# Patient Record
Sex: Female | Born: 1970 | Race: Black or African American | Hispanic: No
Health system: Southern US, Community
[De-identification: ages and names within clinical notes are randomized; demographics above are authoritative.]

---

## 2021-03-20 ENCOUNTER — Inpatient Hospital Stay (HOSPITAL_COMMUNITY): Payer: Medicare Other

## 2021-03-20 ENCOUNTER — Emergency Department (HOSPITAL_COMMUNITY): Payer: Medicare Other

## 2021-03-20 ENCOUNTER — Inpatient Hospital Stay (HOSPITAL_COMMUNITY)
Admission: EM | Admit: 2021-03-20 | Discharge: 2021-04-18 | DRG: 207 | Disposition: E | Payer: Medicare Other | Attending: Pulmonary Disease | Admitting: Pulmonary Disease

## 2021-03-20 DIAGNOSIS — Z66 Do not resuscitate: Secondary | ICD-10-CM | POA: Diagnosis not present

## 2021-03-20 DIAGNOSIS — I517 Cardiomegaly: Secondary | ICD-10-CM | POA: Diagnosis not present

## 2021-03-20 DIAGNOSIS — R570 Cardiogenic shock: Secondary | ICD-10-CM | POA: Diagnosis present

## 2021-03-20 DIAGNOSIS — E662 Morbid (severe) obesity with alveolar hypoventilation: Secondary | ICD-10-CM | POA: Diagnosis present

## 2021-03-20 DIAGNOSIS — Z7189 Other specified counseling: Secondary | ICD-10-CM | POA: Diagnosis not present

## 2021-03-20 DIAGNOSIS — I469 Cardiac arrest, cause unspecified: Secondary | ICD-10-CM | POA: Diagnosis present

## 2021-03-20 DIAGNOSIS — N83202 Unspecified ovarian cyst, left side: Secondary | ICD-10-CM | POA: Diagnosis present

## 2021-03-20 DIAGNOSIS — Z9289 Personal history of other medical treatment: Secondary | ICD-10-CM

## 2021-03-20 DIAGNOSIS — J441 Chronic obstructive pulmonary disease with (acute) exacerbation: Secondary | ICD-10-CM | POA: Diagnosis not present

## 2021-03-20 DIAGNOSIS — N179 Acute kidney failure, unspecified: Secondary | ICD-10-CM | POA: Diagnosis present

## 2021-03-20 DIAGNOSIS — J189 Pneumonia, unspecified organism: Secondary | ICD-10-CM | POA: Diagnosis present

## 2021-03-20 DIAGNOSIS — F17290 Nicotine dependence, other tobacco product, uncomplicated: Secondary | ICD-10-CM | POA: Diagnosis present

## 2021-03-20 DIAGNOSIS — R34 Anuria and oliguria: Secondary | ICD-10-CM | POA: Diagnosis not present

## 2021-03-20 DIAGNOSIS — E872 Acidosis: Secondary | ICD-10-CM | POA: Diagnosis present

## 2021-03-20 DIAGNOSIS — I509 Heart failure, unspecified: Secondary | ICD-10-CM | POA: Diagnosis not present

## 2021-03-20 DIAGNOSIS — J069 Acute upper respiratory infection, unspecified: Secondary | ICD-10-CM | POA: Diagnosis present

## 2021-03-20 DIAGNOSIS — I5033 Acute on chronic diastolic (congestive) heart failure: Secondary | ICD-10-CM | POA: Diagnosis present

## 2021-03-20 DIAGNOSIS — Z9181 History of falling: Secondary | ICD-10-CM

## 2021-03-20 DIAGNOSIS — D72829 Elevated white blood cell count, unspecified: Secondary | ICD-10-CM | POA: Diagnosis not present

## 2021-03-20 DIAGNOSIS — J449 Chronic obstructive pulmonary disease, unspecified: Secondary | ICD-10-CM | POA: Diagnosis present

## 2021-03-20 DIAGNOSIS — G931 Anoxic brain damage, not elsewhere classified: Secondary | ICD-10-CM | POA: Diagnosis present

## 2021-03-20 DIAGNOSIS — R9389 Abnormal findings on diagnostic imaging of other specified body structures: Secondary | ICD-10-CM

## 2021-03-20 DIAGNOSIS — E669 Obesity, unspecified: Secondary | ICD-10-CM | POA: Diagnosis present

## 2021-03-20 DIAGNOSIS — J9622 Acute and chronic respiratory failure with hypercapnia: Secondary | ICD-10-CM | POA: Diagnosis present

## 2021-03-20 DIAGNOSIS — Z515 Encounter for palliative care: Secondary | ICD-10-CM | POA: Diagnosis not present

## 2021-03-20 DIAGNOSIS — G253 Myoclonus: Secondary | ICD-10-CM | POA: Diagnosis present

## 2021-03-20 DIAGNOSIS — J45901 Unspecified asthma with (acute) exacerbation: Secondary | ICD-10-CM | POA: Diagnosis present

## 2021-03-20 DIAGNOSIS — Z6841 Body Mass Index (BMI) 40.0 and over, adult: Secondary | ICD-10-CM

## 2021-03-20 DIAGNOSIS — Z529 Donor of unspecified organ or tissue: Secondary | ICD-10-CM

## 2021-03-20 DIAGNOSIS — E876 Hypokalemia: Secondary | ICD-10-CM | POA: Diagnosis not present

## 2021-03-20 DIAGNOSIS — I468 Cardiac arrest due to other underlying condition: Secondary | ICD-10-CM | POA: Diagnosis present

## 2021-03-20 DIAGNOSIS — R739 Hyperglycemia, unspecified: Secondary | ICD-10-CM | POA: Diagnosis not present

## 2021-03-20 DIAGNOSIS — T17908A Unspecified foreign body in respiratory tract, part unspecified causing other injury, initial encounter: Secondary | ICD-10-CM | POA: Diagnosis present

## 2021-03-20 DIAGNOSIS — J9621 Acute and chronic respiratory failure with hypoxia: Secondary | ICD-10-CM | POA: Diagnosis present

## 2021-03-20 DIAGNOSIS — F17210 Nicotine dependence, cigarettes, uncomplicated: Secondary | ICD-10-CM | POA: Diagnosis present

## 2021-03-20 DIAGNOSIS — J9601 Acute respiratory failure with hypoxia: Secondary | ICD-10-CM

## 2021-03-20 DIAGNOSIS — Z20822 Contact with and (suspected) exposure to covid-19: Secondary | ICD-10-CM | POA: Diagnosis present

## 2021-03-20 DIAGNOSIS — J9690 Respiratory failure, unspecified, unspecified whether with hypoxia or hypercapnia: Secondary | ICD-10-CM

## 2021-03-20 DIAGNOSIS — R296 Repeated falls: Secondary | ICD-10-CM | POA: Diagnosis present

## 2021-03-20 DIAGNOSIS — Z9911 Dependence on respirator [ventilator] status: Secondary | ICD-10-CM | POA: Diagnosis not present

## 2021-03-20 DIAGNOSIS — A419 Sepsis, unspecified organism: Secondary | ICD-10-CM

## 2021-03-20 DIAGNOSIS — J328 Other chronic sinusitis: Secondary | ICD-10-CM | POA: Diagnosis present

## 2021-03-20 DIAGNOSIS — I5031 Acute diastolic (congestive) heart failure: Secondary | ICD-10-CM

## 2021-03-20 LAB — CBC WITH DIFFERENTIAL/PLATELET
Abs Immature Granulocytes: 0.19 10*3/uL — ABNORMAL HIGH (ref 0.00–0.07)
Basophils Absolute: 0.1 10*3/uL (ref 0.0–0.1)
Basophils Relative: 0 %
Eosinophils Absolute: 0 10*3/uL (ref 0.0–0.5)
Eosinophils Relative: 0 %
HCT: 49.5 % — ABNORMAL HIGH (ref 36.0–46.0)
Hemoglobin: 15.3 g/dL — ABNORMAL HIGH (ref 12.0–15.0)
Immature Granulocytes: 1 %
Lymphocytes Relative: 5 %
Lymphs Abs: 0.8 10*3/uL (ref 0.7–4.0)
MCH: 32.3 pg (ref 26.0–34.0)
MCHC: 30.9 g/dL (ref 30.0–36.0)
MCV: 104.7 fL — ABNORMAL HIGH (ref 80.0–100.0)
Monocytes Absolute: 1.5 10*3/uL — ABNORMAL HIGH (ref 0.1–1.0)
Monocytes Relative: 9 %
Neutro Abs: 14.9 10*3/uL — ABNORMAL HIGH (ref 1.7–7.7)
Neutrophils Relative %: 85 %
Platelets: 289 10*3/uL (ref 150–400)
RBC: 4.73 MIL/uL (ref 3.87–5.11)
RDW: 13.5 % (ref 11.5–15.5)
WBC: 17.5 10*3/uL — ABNORMAL HIGH (ref 4.0–10.5)
nRBC: 0.3 % — ABNORMAL HIGH (ref 0.0–0.2)

## 2021-03-20 LAB — I-STAT ARTERIAL BLOOD GAS, ED
Acid-base deficit: 3 mmol/L — ABNORMAL HIGH (ref 0.0–2.0)
Bicarbonate: 29.7 mmol/L — ABNORMAL HIGH (ref 20.0–28.0)
Calcium, Ion: 1.19 mmol/L (ref 1.15–1.40)
HCT: 49 % — ABNORMAL HIGH (ref 36.0–46.0)
Hemoglobin: 16.7 g/dL — ABNORMAL HIGH (ref 12.0–15.0)
O2 Saturation: 100 %
Patient temperature: 97.5
Potassium: 4.2 mmol/L (ref 3.5–5.1)
Sodium: 137 mmol/L (ref 135–145)
TCO2: 32 mmol/L (ref 22–32)
pCO2 arterial: 88 mmHg (ref 32.0–48.0)
pH, Arterial: 7.132 — CL (ref 7.350–7.450)
pO2, Arterial: 240 mmHg — ABNORMAL HIGH (ref 83.0–108.0)

## 2021-03-20 LAB — ECHOCARDIOGRAM COMPLETE
Area-P 1/2: 2.91 cm2
Height: 69 in
S' Lateral: 3.1 cm
Weight: 5266.35 oz

## 2021-03-20 LAB — PROTIME-INR
INR: 1.2 (ref 0.8–1.2)
INR: 1.1 (ref 0.8–1.2)
Prothrombin Time: 14.7 seconds (ref 11.4–15.2)
Prothrombin Time: 13.6 seconds (ref 11.4–15.2)

## 2021-03-20 LAB — POCT I-STAT 7, (LYTES, BLD GAS, ICA,H+H)
Acid-Base Excess: 1 mmol/L (ref 0.0–2.0)
Bicarbonate: 28.4 mmol/L — ABNORMAL HIGH (ref 20.0–28.0)
Calcium, Ion: 1.13 mmol/L — ABNORMAL LOW (ref 1.15–1.40)
HCT: 49 % — ABNORMAL HIGH (ref 36.0–46.0)
Hemoglobin: 16.7 g/dL — ABNORMAL HIGH (ref 12.0–15.0)
O2 Saturation: 91 %
Patient temperature: 36.2
Potassium: 4.1 mmol/L (ref 3.5–5.1)
Sodium: 141 mmol/L (ref 135–145)
TCO2: 30 mmol/L (ref 22–32)
pCO2 arterial: 54.4 mmHg — ABNORMAL HIGH (ref 32.0–48.0)
pH, Arterial: 7.322 — ABNORMAL LOW (ref 7.350–7.450)
pO2, Arterial: 64 mmHg — ABNORMAL LOW (ref 83.0–108.0)

## 2021-03-20 LAB — CBC

## 2021-03-20 LAB — URINALYSIS, ROUTINE W REFLEX MICROSCOPIC
Bilirubin Urine: NEGATIVE
Bilirubin Urine: NEGATIVE
Glucose, UA: 150 mg/dL — AB
Glucose, UA: NEGATIVE mg/dL
Hgb urine dipstick: NEGATIVE
Ketones, ur: NEGATIVE mg/dL
Ketones, ur: NEGATIVE mg/dL
Leukocytes,Ua: NEGATIVE
Leukocytes,Ua: NEGATIVE
Nitrite: NEGATIVE
Nitrite: NEGATIVE
Protein, ur: >=300 mg/dL — AB
Protein, ur: 30 mg/dL — AB
Specific Gravity, Urine: 1.013 (ref 1.005–1.030)
Specific Gravity, Urine: 1.039 — ABNORMAL HIGH (ref 1.005–1.030)
pH: 7 (ref 5.0–8.0)
pH: 5 (ref 5.0–8.0)

## 2021-03-20 LAB — COMPREHENSIVE METABOLIC PANEL
ALT: 31 U/L (ref 0–44)
AST: 58 U/L — ABNORMAL HIGH (ref 15–41)
Albumin: 3.6 g/dL (ref 3.5–5.0)
Alkaline Phosphatase: 75 U/L (ref 38–126)
Anion gap: 14 (ref 5–15)
BUN: 15 mg/dL (ref 6–20)
CO2: 23 mmol/L (ref 22–32)
Calcium: 8.2 mg/dL — ABNORMAL LOW (ref 8.9–10.3)
Chloride: 100 mmol/L (ref 98–111)
Creatinine, Ser: 1.42 mg/dL — ABNORMAL HIGH (ref 0.44–1.00)
GFR, Estimated: 45 mL/min — ABNORMAL LOW (ref >60)
Glucose, Bld: 225 mg/dL — ABNORMAL HIGH (ref 70–99)
Potassium: 4.2 mmol/L (ref 3.5–5.1)
Sodium: 137 mmol/L (ref 135–145)
Total Bilirubin: 0.6 mg/dL (ref 0.3–1.2)
Total Protein: 7.2 g/dL (ref 6.5–8.1)

## 2021-03-20 LAB — I-STAT CHEM 8, ED
BUN: 16 mg/dL (ref 6–20)
Calcium, Ion: 0.99 mmol/L — ABNORMAL LOW (ref 1.15–1.40)
Chloride: 101 mmol/L (ref 98–111)
Creatinine, Ser: 1.2 mg/dL — ABNORMAL HIGH (ref 0.44–1.00)
Glucose, Bld: 228 mg/dL — ABNORMAL HIGH (ref 70–99)
HCT: 52 % — ABNORMAL HIGH (ref 36.0–46.0)
Hemoglobin: 17.7 g/dL — ABNORMAL HIGH (ref 12.0–15.0)
Potassium: 4.2 mmol/L (ref 3.5–5.1)
Sodium: 140 mmol/L (ref 135–145)
TCO2: 28 mmol/L (ref 22–32)

## 2021-03-20 LAB — SARS CORONAVIRUS 2 (TAT 6-24 HRS): SARS Coronavirus 2: NEGATIVE

## 2021-03-20 LAB — RAPID URINE DRUG SCREEN, HOSP PERFORMED
Amphetamines: NOT DETECTED
Barbiturates: NOT DETECTED
Benzodiazepines: POSITIVE — AB
Cocaine: NOT DETECTED
Opiates: POSITIVE — AB
Tetrahydrocannabinol: NOT DETECTED

## 2021-03-20 LAB — GLUCOSE, CAPILLARY
Glucose-Capillary: 152 mg/dL — ABNORMAL HIGH (ref 70–99)
Glucose-Capillary: 156 mg/dL — ABNORMAL HIGH (ref 70–99)
Glucose-Capillary: 136 mg/dL — ABNORMAL HIGH (ref 70–99)
Glucose-Capillary: 195 mg/dL — ABNORMAL HIGH (ref 70–99)

## 2021-03-20 LAB — PROCALCITONIN: Procalcitonin: 0.1 ng/mL

## 2021-03-20 LAB — TROPONIN I (HIGH SENSITIVITY)
Troponin I (High Sensitivity): 145 ng/L (ref <18)
Troponin I (High Sensitivity): 197 ng/L (ref <18)

## 2021-03-20 LAB — HIV ANTIBODY (ROUTINE TESTING W REFLEX): HIV Screen 4th Generation wRfx: NONREACTIVE

## 2021-03-20 LAB — I-STAT BETA HCG BLOOD, ED (MC, WL, AP ONLY): I-stat hCG, quantitative: <5 m[IU]/mL (ref <5)

## 2021-03-20 LAB — CREATININE, SERUM
Creatinine, Ser: 1.13 mg/dL — ABNORMAL HIGH (ref 0.44–1.00)
GFR, Estimated: 59 mL/min — ABNORMAL LOW (ref >60)

## 2021-03-20 LAB — HEMOGLOBIN A1C
Hgb A1c MFr Bld: 6.5 % — ABNORMAL HIGH (ref 4.8–5.6)
Mean Plasma Glucose: 139.85 mg/dL

## 2021-03-20 LAB — LIPASE, BLOOD: Lipase: 29 U/L (ref 11–51)

## 2021-03-20 LAB — STREP PNEUMONIAE URINARY ANTIGEN: Strep Pneumo Urinary Antigen: NEGATIVE

## 2021-03-20 LAB — APTT: aPTT: 32 seconds (ref 24–36)

## 2021-03-20 LAB — AMYLASE: Amylase: 100 U/L (ref 28–100)

## 2021-03-20 LAB — LACTIC ACID, PLASMA
Lactic Acid, Venous: 7.2 mmol/L (ref 0.5–1.9)
Lactic Acid, Venous: 5.7 mmol/L (ref 0.5–1.9)
Lactic Acid, Venous: 2.3 mmol/L (ref 0.5–1.9)
Lactic Acid, Venous: 3.4 mmol/L (ref 0.5–1.9)

## 2021-03-20 LAB — TSH: TSH: 13.078 u[IU]/mL — ABNORMAL HIGH (ref 0.350–4.500)

## 2021-03-20 LAB — CBG MONITORING, ED: Glucose-Capillary: 141 mg/dL — ABNORMAL HIGH (ref 70–99)

## 2021-03-20 LAB — MAGNESIUM: Magnesium: 2.2 mg/dL (ref 1.7–2.4)

## 2021-03-20 LAB — BRAIN NATRIURETIC PEPTIDE: B Natriuretic Peptide: 147.4 pg/mL — ABNORMAL HIGH (ref 0.0–100.0)

## 2021-03-20 LAB — AMMONIA: Ammonia: 78 umol/L — ABNORMAL HIGH (ref 9–35)

## 2021-03-20 LAB — MRSA PCR SCREENING: MRSA by PCR: NEGATIVE

## 2021-03-20 MED ORDER — FENTANYL CITRATE (PF) 100 MCG/2ML IJ SOLN
100.0000 ug | INTRAMUSCULAR | Status: DC | PRN
Start: 2021-03-20 — End: 2021-03-24
  Administered 2021-03-22 (×2): 100 ug via INTRAVENOUS
  Filled 2021-03-20 (×5): qty 2

## 2021-03-20 MED ORDER — HEPARIN SODIUM (PORCINE) 5000 UNIT/ML IJ SOLN
5000.0000 [IU] | Freq: Three times a day (TID) | INTRAMUSCULAR | Status: DC
  Administered 2021-03-20 – 2021-03-24 (×12): 5000 [IU] via SUBCUTANEOUS
  Filled 2021-03-20 (×12): qty 1

## 2021-03-20 MED ORDER — VITAL AF 1.2 CAL PO LIQD
1000.0000 mL | ORAL | Status: DC
  Administered 2021-03-20: 1000 mL

## 2021-03-20 MED ORDER — FENTANYL CITRATE (PF) 100 MCG/2ML IJ SOLN
100.0000 ug | INTRAMUSCULAR | Status: DC | PRN
  Administered 2021-03-24: 100 ug via INTRAVENOUS
  Filled 2021-03-20: qty 2

## 2021-03-20 MED ORDER — SODIUM CHLORIDE 0.9 % IV SOLN: 1.0000 g | Freq: Every day | INTRAVENOUS | Status: DC

## 2021-03-20 MED ORDER — ASPIRIN 300 MG RE SUPP
300.0000 mg | RECTAL | Status: AC
  Administered 2021-03-20: 300 mg via RECTAL
  Filled 2021-03-20: qty 1

## 2021-03-20 MED ORDER — VITAL HIGH PROTEIN PO LIQD: 1000.0000 mL | ORAL | Status: DC

## 2021-03-20 MED ORDER — FENTANYL CITRATE (PF) 100 MCG/2ML IJ SOLN
50.0000 ug | Freq: Once | INTRAMUSCULAR | Status: DC
Start: 2021-03-20 — End: 2021-03-24
  Filled 2021-03-20 (×2): qty 2

## 2021-03-20 MED ORDER — PERFLUTREN LIPID MICROSPHERE
1.0000 mL | INTRAVENOUS | Status: AC | PRN
  Administered 2021-03-20: 2 mL via INTRAVENOUS
  Filled 2021-03-20: qty 10

## 2021-03-20 MED ORDER — PROSOURCE TF PO LIQD
45.0000 mL | Freq: Three times a day (TID) | ORAL | Status: DC
  Administered 2021-03-20 – 2021-03-23 (×12): 45 mL
  Filled 2021-03-20 (×12): qty 45

## 2021-03-20 MED ORDER — ALBUTEROL SULFATE (2.5 MG/3ML) 0.083% IN NEBU
2.5000 mg | INHALATION_SOLUTION | Freq: Once | RESPIRATORY_TRACT | Status: AC
  Administered 2021-03-20: 2.5 mg via RESPIRATORY_TRACT

## 2021-03-20 MED ORDER — CHLORHEXIDINE GLUCONATE CLOTH 2 % EX PADS
6.0000 | MEDICATED_PAD | Freq: Every day | CUTANEOUS | Status: DC
  Administered 2021-03-20 – 2021-03-25 (×6): 6 via TOPICAL

## 2021-03-20 MED ORDER — MIDAZOLAM HCL 2 MG/2ML IJ SOLN
2.0000 mg | INTRAMUSCULAR | Status: DC | PRN
  Administered 2021-03-20: 2 mg via INTRAVENOUS
  Filled 2021-03-20 (×3): qty 2

## 2021-03-20 MED ORDER — FENTANYL CITRATE (PF) 100 MCG/2ML IJ SOLN
INTRAMUSCULAR | Status: AC
  Administered 2021-03-20: 100 ug via INTRAVENOUS
  Filled 2021-03-20: qty 2

## 2021-03-20 MED ORDER — ASPIRIN 300 MG RE SUPP: 300.0000 mg | RECTAL | Status: AC

## 2021-03-20 MED ORDER — SODIUM CHLORIDE 0.9 % IV BOLUS
1000.0000 mL | Freq: Once | INTRAVENOUS | Status: AC
  Administered 2021-03-20: 1000 mL via INTRAVENOUS

## 2021-03-20 MED ORDER — NOREPINEPHRINE 4 MG/250ML-% IV SOLN
0.0000 ug/min | INTRAVENOUS | Status: DC
  Administered 2021-03-20: 10 ug/min via INTRAVENOUS
  Filled 2021-03-20: qty 250

## 2021-03-20 MED ORDER — IPRATROPIUM-ALBUTEROL 0.5-2.5 (3) MG/3ML IN SOLN
3.0000 mL | RESPIRATORY_TRACT | Status: DC
  Administered 2021-03-20 – 2021-03-26 (×31): 3 mL via RESPIRATORY_TRACT
  Filled 2021-03-20 (×31): qty 3

## 2021-03-20 MED ORDER — METHYLPREDNISOLONE SODIUM SUCC 125 MG IJ SOLR: 80.0000 mg | Freq: Two times a day (BID) | INTRAMUSCULAR | Status: DC

## 2021-03-20 MED ORDER — SODIUM CHLORIDE 0.9 % IV SOLN: INTRAVENOUS | Status: DC | PRN

## 2021-03-20 MED ORDER — ONDANSETRON HCL 4 MG/2ML IJ SOLN: 4.0000 mg | Freq: Four times a day (QID) | INTRAMUSCULAR | Status: DC | PRN

## 2021-03-20 MED ORDER — ACETAMINOPHEN 160 MG/5ML PO SOLN
650.0000 mg | ORAL | Status: DC
  Administered 2021-03-20 – 2021-03-24 (×22): 650 mg
  Filled 2021-03-20 (×22): qty 20.3

## 2021-03-20 MED ORDER — FENTANYL 2500MCG IN NS 250ML (10MCG/ML) PREMIX INFUSION
25.0000 ug/h | INTRAVENOUS | Status: DC
  Administered 2021-03-20: 50 ug/h via INTRAVENOUS
  Administered 2021-03-20: 400 ug/h via INTRAVENOUS
  Administered 2021-03-21: 125 ug/h via INTRAVENOUS
  Administered 2021-03-21: 400 ug/h via INTRAVENOUS
  Administered 2021-03-22: 50 ug/h via INTRAVENOUS
  Administered 2021-03-23: 150 ug/h via INTRAVENOUS
  Filled 2021-03-20 (×7): qty 250

## 2021-03-20 MED ORDER — SODIUM CHLORIDE 0.9 % IV SOLN
250.0000 mL | INTRAVENOUS | Status: DC
  Administered 2021-03-20 – 2021-03-23 (×2): 250 mL via INTRAVENOUS

## 2021-03-20 MED ORDER — DOCUSATE SODIUM 100 MG PO CAPS: 100.0000 mg | ORAL_CAPSULE | Freq: Two times a day (BID) | ORAL | Status: DC | PRN

## 2021-03-20 MED ORDER — SODIUM CHLORIDE 0.9 % IV SOLN
500.0000 mg | INTRAVENOUS | Status: AC
  Administered 2021-03-20 – 2021-03-24 (×5): 500 mg via INTRAVENOUS
  Filled 2021-03-20 (×5): qty 500

## 2021-03-20 MED ORDER — ORAL CARE MOUTH RINSE
15.0000 mL | OROMUCOSAL | Status: DC
  Administered 2021-03-20 – 2021-03-26 (×52): 15 mL via OROMUCOSAL

## 2021-03-20 MED ORDER — INSULIN ASPART 100 UNIT/ML ~~LOC~~ SOLN
0.0000 [IU] | SUBCUTANEOUS | Status: DC
  Administered 2021-03-20: 3 [IU] via SUBCUTANEOUS
  Administered 2021-03-20: 2 [IU] via SUBCUTANEOUS
  Administered 2021-03-20 (×2): 3 [IU] via SUBCUTANEOUS
  Administered 2021-03-21: 5 [IU] via SUBCUTANEOUS
  Administered 2021-03-21: 3 [IU] via SUBCUTANEOUS
  Administered 2021-03-21 (×3): 5 [IU] via SUBCUTANEOUS
  Administered 2021-03-21 – 2021-03-22 (×2): 3 [IU] via SUBCUTANEOUS
  Administered 2021-03-22: 2 [IU] via SUBCUTANEOUS
  Administered 2021-03-22: 8 [IU] via SUBCUTANEOUS

## 2021-03-20 MED ORDER — ALBUTEROL SULFATE (2.5 MG/3ML) 0.083% IN NEBU
INHALATION_SOLUTION | RESPIRATORY_TRACT | Status: AC
  Administered 2021-03-20: 2.5 mg
  Filled 2021-03-20: qty 3

## 2021-03-20 MED ORDER — FENTANYL BOLUS VIA INFUSION
50.0000 ug | INTRAVENOUS | Status: DC | PRN
  Administered 2021-03-20: 50 ug via INTRAVENOUS
  Filled 2021-03-20: qty 50

## 2021-03-20 MED ORDER — MIDAZOLAM HCL 2 MG/2ML IJ SOLN
2.0000 mg | INTRAMUSCULAR | Status: DC | PRN
  Administered 2021-03-20: 2 mg via INTRAVENOUS
  Filled 2021-03-20: qty 2

## 2021-03-20 MED ORDER — CHLORHEXIDINE GLUCONATE 0.12% ORAL RINSE (MEDLINE KIT)
15.0000 mL | Freq: Two times a day (BID) | OROMUCOSAL | Status: DC
  Administered 2021-03-20 – 2021-03-26 (×12): 15 mL via OROMUCOSAL

## 2021-03-20 MED ORDER — ALBUTEROL SULFATE (2.5 MG/3ML) 0.083% IN NEBU
2.5000 mg | INHALATION_SOLUTION | RESPIRATORY_TRACT | Status: DC | PRN
  Administered 2021-03-22 (×3): 2.5 mg via RESPIRATORY_TRACT
  Filled 2021-03-20 (×2): qty 3

## 2021-03-20 MED ORDER — PANTOPRAZOLE SODIUM 40 MG IV SOLR
40.0000 mg | Freq: Every day | INTRAVENOUS | Status: DC
  Administered 2021-03-20 – 2021-03-23 (×4): 40 mg via INTRAVENOUS
  Filled 2021-03-20 (×4): qty 40

## 2021-03-20 MED ORDER — MIDAZOLAM HCL 2 MG/2ML IJ SOLN
INTRAMUSCULAR | Status: AC
  Administered 2021-03-20: 2 mg via INTRAVENOUS
  Filled 2021-03-20: qty 2

## 2021-03-20 MED ORDER — PROPOFOL 1000 MG/100ML IV EMUL
5.0000 ug/kg/min | INTRAVENOUS | Status: DC
  Administered 2021-03-20: 60 ug/kg/min via INTRAVENOUS
  Administered 2021-03-20: 50 ug/kg/min via INTRAVENOUS
  Administered 2021-03-20: 5 ug/kg/min via INTRAVENOUS
  Administered 2021-03-20: 60 ug/kg/min via INTRAVENOUS
  Administered 2021-03-21: 50 ug/kg/min via INTRAVENOUS
  Administered 2021-03-21: 40 ug/kg/min via INTRAVENOUS
  Administered 2021-03-21: 60 ug/kg/min via INTRAVENOUS
  Administered 2021-03-22: 40 ug/kg/min via INTRAVENOUS
  Administered 2021-03-22: 30 ug/kg/min via INTRAVENOUS
  Administered 2021-03-22 (×2): 40 ug/kg/min via INTRAVENOUS
  Administered 2021-03-22: 5 ug/kg/min via INTRAVENOUS
  Administered 2021-03-22: 30 ug/kg/min via INTRAVENOUS
  Administered 2021-03-23 (×3): 40 ug/kg/min via INTRAVENOUS
  Filled 2021-03-20 (×2): qty 100
  Filled 2021-03-20 (×2): qty 200
  Filled 2021-03-20 (×7): qty 100

## 2021-03-20 MED ORDER — SODIUM CHLORIDE 0.9 % IV SOLN
1.0000 g | Freq: Every day | INTRAVENOUS | Status: DC
  Administered 2021-03-20: 1 g via INTRAVENOUS
  Filled 2021-03-20: qty 10
  Filled 2021-03-20: qty 1

## 2021-03-20 MED ORDER — ACETAMINOPHEN 325 MG PO TABS: 650.0000 mg | ORAL_TABLET | ORAL | Status: DC

## 2021-03-20 MED ORDER — NOREPINEPHRINE 4 MG/250ML-% IV SOLN
2.0000 ug/min | INTRAVENOUS | Status: DC
  Administered 2021-03-21: 2 ug/min via INTRAVENOUS
  Filled 2021-03-20 (×2): qty 250

## 2021-03-20 MED ORDER — POLYETHYLENE GLYCOL 3350 17 G PO PACK: 17.0000 g | PACK | Freq: Every day | ORAL | Status: DC | PRN

## 2021-03-20 MED ORDER — METHYLPREDNISOLONE SODIUM SUCC 125 MG IJ SOLR
80.0000 mg | Freq: Two times a day (BID) | INTRAMUSCULAR | Status: DC
  Administered 2021-03-20 – 2021-03-21 (×2): 80 mg via INTRAVENOUS
  Filled 2021-03-20 (×2): qty 2

## 2021-03-20 MED ORDER — ACETAMINOPHEN 650 MG RE SUPP: 650.0000 mg | RECTAL | Status: DC

## 2021-03-20 MED ORDER — IOHEXOL 350 MG/ML SOLN
100.0000 mL | Freq: Once | INTRAVENOUS | Status: AC | PRN
  Administered 2021-03-20: 100 mL via INTRAVENOUS

## 2021-03-20 NOTE — Progress Notes (Signed)
Initial Nutrition Assessment  DOCUMENTATION CODES:   Obesity unspecified  INTERVENTION:   Tube feeding:  -Vital 1.2 @ 20 ml/hr via OG -Increase by 10 ml Q4 hours to goal rate of 60 ml/hr (1440 ml) -ProSource TF 45 ml TID  Provides: 1848 kcals, 141 grams protein, 1168 ml free water.   NUTRITION DIAGNOSIS:   Increased nutrient needs related to acute illness as evidenced by estimated needs.  GOAL:   Patient will meet greater than or equal to 90% of their needs  MONITOR:   Vent status,Skin,TF tolerance,Weight trends,Labs,I & O's  REASON FOR ASSESSMENT:   Consult,Ventilator Enteral/tube feeding initiation and management  ASSESSMENT:   Patient with PMH significant for asthma and COPD. Presents this admission s/p PEA arrest.   Unknown etiology of cardiac arrest. TTM 36. Off pressors. OG located in the stomach per xray. Okay to start feeding per CCM.   Weight history limited at this time. Utilize EDW of 149.3 kg for now.   Patient is currently intubated on ventilator support MV: 10.6 L/min Temp (24hrs), Avg:96.5 F (35.8 C), Min:94.8 F (34.9 C), Max:97.7 F (36.5 C)   UOP: 250 ml since admit   Medications: SS novolog, solumedrol Labs: Cr 1.13- trending down CBG 141-228  Diet Order:   Diet Order            Diet NPO time specified  Diet effective now                 EDUCATION NEEDS:   Not appropriate for education at this time  Skin:  Skin Assessment: Reviewed RN Assessment  Last BM:  PTA  Height:   Ht Readings from Last 1 Encounters:  04/02/2021 5\' 9"  (1.753 m)    Weight:   Wt Readings from Last 1 Encounters:  04/04/2021 (!) 149.3 kg    BMI:  Body mass index is 48.61 kg/m.  Estimated Nutritional Needs:   Kcal:  05/20/21  Protein:  132-164 grams  Fluid:  >/= 1.6 L/day  [phone] RD, LDN Clinical Nutrition Pager listed in AMION

## 2021-03-20 NOTE — Plan of Care (Signed)
  Problem: Clinical Measurements: Goal: Ability to maintain clinical measurements within normal limits will improve Outcome: Progressing Goal: Diagnostic test results will improve Outcome: Progressing   Problem: Nutrition: Goal: Adequate nutrition will be maintained Outcome: Progressing   

## 2021-03-20 NOTE — Code Documentation (Signed)
Placed pads on pt.

## 2021-03-20 NOTE — H&P (Signed)
NAME:  Haidynn Almendarez, MRN:  [id number], DOB:  [birth year], LOS: 0 ADMISSION DATE:  03/24/2021, CONSULTATION DATE:  03/30/2021 REFERRING MD:  Rancour, CHIEF COMPLAINT: found down, PEA cardiac arrest.   History of Present Illness:  Ms. Schiro was found unresponsive with agonal breathing by family after being seen normal at 9pm.  On EMS arrival lost pulses, developed PEA, received CPR and 2 doses epinephrine.  King airway placed in the field, then intubated with ET tube in the ED.    In ED received ASA 300 pr, ns 1L, fentanyl and versed started, levophed started.    7.132/88/240 at 5am.   Currently sedated, but apparently was awake with eyes open prior to sedation.   She lives in [place], staying with her daughter [name] in [place] right now.   Daughter noticed she wasn't very responsive at 1030pm, was sliding out of bed.  She assumed she was just very sleepy, she also saw some food in her mouth.   She assumed she was drowsy from medications.  She woke up and tried to walk, fell and hit her head several times.   Per daughter, she does have and inhaler, has copd/asthma.  She has had a cough recently.    Smokes 1/2-1ppd per daughter.    Pertinent  Medical History  Obesity Asthma  Significant Hospital Events: Including procedures, antibiotic start and stop dates in addition to other pertinent events   .   Interim History / Subjective:    Objective   Blood pressure 108/68, pulse 78, temperature 97.7 F (36.5 C), temperature source Tympanic, resp. rate (!) 24, SpO2 100 %.    Vent Mode: PRVC FiO2 (%):  [100 %] 100 % Set Rate:  [20 bmp] 20 bmp Vt Set:  [540 mL] 540 mL PEEP:  [5 cmH20] 5 cmH20 Plateau Pressure:  [27 cmH20] 27 cmH20  No intake or output data in the 24 hours ending 03/31/2021 0516 There were no vitals filed for this visit.  Examination: General: intubated, NAD, obesity  HENT: perrl, intubated, OG tube Lungs: B rhonchi, wheezes throughout Cardiovascular: RRR  no mgr  Abdomen: no apparent tenderness, nbs, nd Extremities: normal,  Neuro: non responsive (only on Fentanyl infusion now)  Labs/imaging that I havepersonally reviewed  (right click and "Reselect all SmartList Selections" daily)  7.132/88/240 CMP, CBC pending  CXR: cardiomegaly, atelectasis, consolidation IMPRESSION: 1. Well-positioned support structures. 2. Mild cardiomegaly. Patchy parahilar lung opacities bilaterally, which could represent pulmonary edema, atelectasis or aspiration.  Resolved Hospital Problem list     Assessment & Plan:  PEA cardiac arrest: cause not entirely clear at this time.  Cardiomegaly is notable, as is the pulmonary acidosis.  Based on exam, suspicious of asthma or copd exacerbation.  Check echo, CT chest pending, systemic steroids and antibiotics trial.   Normothermia protocol . Cont vent settings.  Current Plateau pressure is 28.   Decreased Fio2 to 70 percent and patient tolerating fine.    AKI Cr 1.2 Hb 16.7  Best practice (right click and "Reselect all SmartList Selections" daily)  Diet:  NPO Pain/Anxiety/Delirium protocol (if indicated): Yes (RASS goal -2) VAP protocol (if indicated): Yes DVT prophylaxis: Subcutaneous Heparin GI prophylaxis: PPI Glucose control:  SSI No Central venous access:  N/A Arterial line:  N/A Foley:  Yes  Mobility:  bed rest  PT consulted: N/A Last date of multidisciplinary goals of care discussion []  Code Status:  full code Disposition: ICU Discussed her care and prognosis with daughter .  Labs   CBC: Recent Labs  Lab 03-29-21 0512  HGB 17.7*  HCT 52.0*    Basic Metabolic Panel: Recent Labs  Lab Mar 29, 2021 0512  NA 140  K 4.2  CL 101  GLUCOSE 228*  BUN 16  CREATININE 1.20*   GFR: CrCl cannot be calculated (Unknown ideal weight.). No results for input(s): PROCALCITON, WBC, LATICACIDVEN in the last 168 hours.  Liver Function Tests: No results for input(s): AST, ALT,  ALKPHOS, BILITOT, PROT, ALBUMIN in the last 168 hours. No results for input(s): LIPASE, AMYLASE in the last 168 hours. No results for input(s): AMMONIA in the last 168 hours.  ABG    Component Value Date/Time   TCO2 28 03-29-2021 0512     Coagulation Profile: No results for input(s): INR, PROTIME in the last 168 hours.  Cardiac Enzymes: No results for input(s): CKTOTAL, CKMB, CKMBINDEX, TROPONINI in the last 168 hours.  HbA1C: No results found for: HGBA1C  CBG: No results for input(s): GLUCAP in the last 168 hours.  Review of Systems:   Unable to asses  Past Medical History:  She,  has no past medical history on file.   Surgical History:     Social History:      Family History:  Her family history is not on file.   Allergies Not on File   Home Medications  Prior to Admission medications   Not on File     Critical care time: 45 min.

## 2021-03-20 NOTE — Progress Notes (Signed)
  Echocardiogram 2D Echocardiogram has been performed.  Valerie James 29-Mar-2021, 5:08 PM

## 2021-03-20 NOTE — Code Documentation (Signed)
Pt currently on LUCAS. Advanced airway by EMS.

## 2021-03-20 NOTE — ED Provider Notes (Signed)
[facility] EMERGENCY DEPARTMENT Provider Note   CSN: [id number] Arrival date & time: 03/23/2021  0444     History No chief complaint on file.   Valerie James is a 50 y.o. female.  Level 5 caveat for acuity of condition.  Patient presents post arrest.  EMS was called out for unresponsive person.  Patient was last seen normal about 9 PM.  When her family to check on her around 4 AM she was found on the ground with agonal breathing.  On EMS arrival patient had agonal respirations and quickly lost pulses progressed to PEA.  She received CPR for approximately 10 minutes and 2 rounds of epinephrine.  Spontaneous circulation was restored.  EKG showed no ST elevation.  Patient was intubated with a King airway and brought to the emergency department.  Has a history of obesity as well as asthma by report.  Discussion with patient's daughter Valerie James by phone.  She states patient has had a cough and cold and congestion for several days.  She did cold medicine around 10 PM.  She had multiple falls throughout the night and she thinks she hit her head on a bedside table.  EMS reports called this morning because she was slow to respond and not breathing very well. [facility] reports no recent fever or chest pain.  The history is provided by the EMS personnel. The history is limited by the condition of the patient.       No past medical history on file.  There are no problems to display for this patient.   The histories are not reviewed yet. Please review them in the "History" navigator section and refresh this [place].   OB History   No obstetric history on file.     No family history on file.     Home Medications Prior to Admission medications   Not on File    Allergies    Patient has no allergy information on record.  Review of Systems   Review of Systems  Unable to perform ROS: Acuity of condition    Physical Exam Updated Vital Signs BP 108/68   Pulse 78    Temp 97.7 F (36.5 C) (Tympanic)   Resp (!) 24   SpO2 100%   Physical Exam Vitals and nursing note reviewed.  Constitutional:      General: She is not in acute distress.    Appearance: She is well-developed. She is obese.     Comments: Unresponsive, breathing over bagging.  Gag is present.  HENT:     Head: Normocephalic and atraumatic.     Mouth/Throat:     Pharynx: No oropharyngeal exudate.     Comments: Vomit and bloody airway Eyes:     Conjunctiva/sclera: Conjunctivae normal.     Comments: Right pupil is irregular, nonreactive approximately 4 mm.  Left pupil is 2 mm  Neck:     Comments: No meningismus. Cardiovascular:     Rate and Rhythm: Normal rate and regular rhythm.     Heart sounds: Normal heart sounds. No murmur heard.   Pulmonary:     Effort: Respiratory distress present.     Breath sounds: Rhonchi present.     Comments: Equal breath sounds, rhonchi Abdominal:     Palpations: Abdomen is soft.     Tenderness: There is no abdominal tenderness. There is no guarding or rebound.  Musculoskeletal:        General: No swelling, tenderness or deformity. Normal range of motion.  Cervical back: Normal range of motion and neck supple.  Skin:    General: Skin is warm.  Neurological:     Motor: No abnormal muscle tone.     Comments: Unresponsive, no spontaneous movement     ED Results / Procedures / Treatments   Labs (all labs ordered are listed, but only abnormal results are displayed) Labs Reviewed  BRAIN NATRIURETIC PEPTIDE - Abnormal; Notable for the following components:      Result Value   B Natriuretic Peptide 147.4 (*)    All other components within normal limits  LACTIC ACID, PLASMA - Abnormal; Notable for the following components:   Lactic Acid, Venous 7.2 (*)    All other components within normal limits  URINALYSIS, ROUTINE W REFLEX MICROSCOPIC - Abnormal; Notable for the following components:   APPearance CLOUDY (*)    Glucose, UA 150 (*)    Hgb  urine dipstick SMALL (*)    Protein, ur >=300 (*)    Bacteria, UA MANY (*)    All other components within normal limits  AMMONIA - Abnormal; Notable for the following components:   Ammonia 78 (*)    All other components within normal limits  TSH - Abnormal; Notable for the following components:   TSH 13.078 (*)    All other components within normal limits  I-STAT ARTERIAL BLOOD GAS, ED - Abnormal; Notable for the following components:   pH, Arterial 7.132 (*)    pCO2 arterial 88.0 (*)    pO2, Arterial 240 (*)    Bicarbonate 29.7 (*)    Acid-base deficit 3.0 (*)    HCT 49.0 (*)    Hemoglobin 16.7 (*)    All other components within normal limits  I-STAT CHEM 8, ED - Abnormal; Notable for the following components:   Creatinine, Ser 1.20 (*)    Glucose, Bld 228 (*)    Calcium, Ion 0.99 (*)    Hemoglobin 17.7 (*)    HCT 52.0 (*)    All other components within normal limits  CBG MONITORING, ED - Abnormal; Notable for the following components:   Glucose-Capillary 141 (*)    All other components within normal limits  CULTURE, BLOOD (ROUTINE X 2)  CULTURE, BLOOD (ROUTINE X 2)  SARS CORONAVIRUS 2 (TAT 6-24 HRS)  CULTURE, BLOOD (ROUTINE X 2)  CULTURE, BLOOD (ROUTINE X 2)  CULTURE, RESPIRATORY W GRAM STAIN  MRSA PCR SCREENING  APTT  PROTIME-INR  CBC WITH DIFFERENTIAL/PLATELET  COMPREHENSIVE METABOLIC PANEL  LACTIC ACID, PLASMA  RAPID URINE DRUG SCREEN, HOSP PERFORMED  MAGNESIUM  CBC  CREATININE, SERUM  CORTISOL  PROCALCITONIN  LIPASE, BLOOD  AMYLASE  PROTIME-INR  URINALYSIS, ROUTINE W REFLEX MICROSCOPIC  STREP PNEUMONIAE URINARY ANTIGEN  LEGIONELLA PNEUMOPHILA SEROGP 1 UR AG  HIV ANTIBODY (ROUTINE TESTING W REFLEX)  BLOOD GAS, ARTERIAL  I-STAT BETA HCG BLOOD, ED ([facility], [facility], [facility] ONLY)  TROPONIN I (HIGH SENSITIVITY)  TROPONIN I (HIGH SENSITIVITY)    EKG EKG Interpretation  Date/Time:  Saturday March 20 2021 04:48:23 EDT Ventricular Rate:  88 PR Interval:  195 QRS  Duration: 85 QT Interval:  363 QTC Calculation: 440 R Axis:   74 Text Interpretation: Sinus rhythm Ventricular premature complex Biatrial enlargement Borderline low voltage, extremity leads No previous ECGs available Confirmed by Ezequiel Essex [phone]) on 03/23/2021 5:04:40 AM   Radiology CT Head Wo Contrast  Result Date: 04/14/2021 CLINICAL DATA:  Head trauma, post CPR, intubated EXAM: CT HEAD WITHOUT CONTRAST TECHNIQUE: Contiguous axial images were obtained  from the base of the skull through the vertex without intravenous contrast. COMPARISON:  None. FINDINGS: Brain: No evidence of parenchymal hemorrhage or extra-axial fluid collection. No mass lesion, mass effect, or midline shift. No CT evidence of acute infarction. Cerebral volume is age appropriate. No ventriculomegaly. Vascular: No acute abnormality. Skull: No evidence of calvarial fracture. Sinuses/Orbits: Mucoperiosteal thickening throughout the visualized paranasal sinuses, without fluid levels. Other:  The mastoid air cells are unopacified. IMPRESSION: 1. No evidence of acute intracranial abnormality. No evidence of calvarial fracture. 2. Chronic appearing paranasal sinusitis. Electronically Signed   By: Ilona Sorrel M.D.   On: 03/22/2021 07:13   CT Angio Chest PE W and/or Wo Contrast  Result Date: 04/08/2021 CLINICAL DATA:  Post CPR, intubated, concern for PE EXAM: CT ANGIOGRAPHY CHEST WITH CONTRAST TECHNIQUE: Multidetector CT imaging of the chest was performed using the standard protocol during bolus administration of intravenous contrast. Multiplanar CT image reconstructions and MIPs were obtained to evaluate the vascular anatomy. CONTRAST:  127m OMNIPAQUE IOHEXOL 350 MG/ML SOLN COMPARISON:  Chest radiograph from earlier today. FINDINGS: Cardiovascular: The study is moderate quality for the evaluation of pulmonary embolism, with some motion degradation. There are no convincing filling defects in the central, lobar, segmental or  subsegmental pulmonary artery branches to suggest acute pulmonary embolism. Normal course and caliber of the thoracic aorta. Top-normal caliber main pulmonary artery (3.0 cm diameter). Mild cardiomegaly. No significant pericardial fluid/thickening. Mediastinum/Nodes: No discrete thyroid nodules. Unremarkable esophagus. No pathologically enlarged axillary, mediastinal or hilar lymph nodes. Lungs/Pleura: No pneumothorax. No pleural effusion. Endotracheal tube tip is 2.4 cm above the carina. Moderate hypoventilatory changes throughout the dependent lungs. No lung masses or significant pulmonary nodules in the aerated portions of the lungs. Upper abdomen: Enteric tube enters the stomach with the tip not seen on this study. Contrast reflux into the dilated IVC and hepatic veins. Musculoskeletal:  No aggressive appearing focal osseous lesions. Review of the MIP images confirms the above findings. IMPRESSION: 1. No evidence of pulmonary embolism. 2. Mild cardiomegaly. Contrast reflux into the dilated IVC and hepatic veins, suggesting right heart failure. 3. Moderate hypoventilatory changes throughout the dependent lungs. 4. Well-positioned support structures. Electronically Signed   By: JIlona SorrelM.D.   On: 03/31/2021 07:23   DG Chest Portable 1 View  Result Date: 04/11/2021 CLINICAL DATA:  Post CPR, intubated EXAM: PORTABLE CHEST 1 VIEW COMPARISON:  None. FINDINGS: Endotracheal tube tip is 2.7 cm above the carina. Enteric tube enters stomach with the tip not seen on this image. Pacer pads overlie the left chest. Mild cardiomegaly. Otherwise normal mediastinal contour. No pneumothorax. No pleural effusion. Low lung volumes. Patchy opacities in the parahilar lungs bilaterally. IMPRESSION: 1. Well-positioned support structures. 2. Mild cardiomegaly. Patchy parahilar lung opacities bilaterally, which could represent pulmonary edema, atelectasis or aspiration. Electronically Signed   By: JIlona SorrelM.D.   On:  03/21/2021 05:33    Procedures Procedure Name: Intubation Date/Time: 03/22/2021 5:13 AM Performed by: REzequiel Essex MD Pre-anesthesia Checklist: Patient identified, Patient being monitored, Emergency Drugs available, Timeout performed and Suction available Oxygen Delivery Method: Non-rebreather mask Preoxygenation: Pre-oxygenation with 100% oxygen Ventilation: Mask ventilation without difficulty Laryngoscope Size: Mac and 4 Grade View: Grade III Tube size: 7.5 mm Number of attempts: 1 Airway Equipment and Method: Video-laryngoscopy,  Bougie stylet and Patient positioned with wedge pillow Placement Confirmation: ETT inserted through vocal cords under direct vision,  CO2 detector and Breath sounds checked- equal and bilateral Secured at: 25 cm Tube secured  with: ETT holder Dental Injury: Teeth and Oropharynx as per pre-operative assessment  Difficulty Due To: Difficult Airway- due to large tongue, Difficult Airway- due to limited oral opening, Difficult Airway- due to anterior larynx, Difficult Airway- due to reduced neck mobility and Difficulty was anticipated Future Recommendations: Recommend- induction with short-acting agent, and alternative techniques readily available     .Critical Care Performed by: Ezequiel Essex, MD Authorized by: Ezequiel Essex, MD   Critical care provider statement:    Critical care time (minutes):  60   Critical care was necessary to treat or prevent imminent or life-threatening deterioration of the following conditions:  Cardiac failure and respiratory failure   Critical care was time spent personally by me on the following activities:  Discussions with consultants, evaluation of patient's response to treatment, examination of patient, ordering and performing treatments and interventions, ordering and review of laboratory studies, ordering and review of radiographic studies, pulse oximetry, re-evaluation of patient's condition, obtaining history from  patient or surrogate and review of old charts     Medications Ordered in ED Medications  aspirin suppository 300 mg (has no administration in time range)  sodium chloride 0.9 % bolus 1,000 mL (has no administration in time range)  fentaNYL (SUBLIMAZE) injection 100 mcg (has no administration in time range)  fentaNYL (SUBLIMAZE) injection 100 mcg (has no administration in time range)  midazolam (VERSED) injection 2 mg (has no administration in time range)  midazolam (VERSED) injection 2 mg (has no administration in time range)    ED Course  I have reviewed the triage vital signs and the nursing notes.  Pertinent labs & imaging results that were available during my care of the patient were reviewed by me and considered in my medical decision making (see chart for details).    MDM Rules/Calculators/A&P                         Patient from home with post arrest.  Agonal respirations progressed to PEA.  Received epinephrine x2 and 10 minutes of CPR.  ROSC on arrival.  EKG shows no ST elevation.  Cooling pads placed on patient. King airway exchanged for ET tube  ABG shows hypercarbic respiratory acidosis.  Rate increased to 26 PH 7.1 CXR with edema and mildly patchy opacities.   Cooling packs placed to groins and axilla. No STEMI on EKG.   Admission discussed with ICU care team and Dr. Patsey Berthold.  Patient is starting to wake up and is given as needed sedation with Versed and fentanyl. Blood pressure has trickled down after epinephrine has worn off and IV Levophed started on the low-dose.  CT head is pending as well as CT angiogram chest to rule out pulmonary embolism.  Valerie James was evaluated in Emergency Department on 03/27/2021 for the symptoms described in the history of present illness. She was evaluated in the context of the global COVID-19 pandemic, which necessitated consideration that the patient might be at risk for infection with the SARS-CoV-2 virus that causes COVID-19.  Institutional protocols and algorithms that pertain to the evaluation of patients at risk for COVID-19 are in a state of rapid change based on information released by regulatory bodies including the CDC and federal and state organizations. These policies and algorithms were followed during the patient's care in the ED.   Final Clinical Impression(s) / ED Diagnoses Final diagnoses:  Cardiac arrest [facility])    Rx / DC Orders ED Discharge Orders    None  Ezequiel Essex, MD 04/16/2021 [phone]

## 2021-03-20 NOTE — Significant Event (Signed)
Patient arrived to [phone] after completing CT scanning from the ED. Placed on monitors. Patient arrived with Fenatnyl @ , Levo @ 20. Levophed drips tiltrated down as BP >150s. Patient has several skin tears and blisters; notable skin tears on midsternal and right buttocks. Intact blisters on right abdomen. No family at bedside; no belongings.   Wallis Bamberg, RN

## 2021-03-20 NOTE — ED Notes (Signed)
Delila Pereyra (son of pt) and [name] (daughter of pt), called for an update please contact [phone]

## 2021-03-20 NOTE — ED Notes (Signed)
RT and CT aware that pt has a bed upstairs.

## 2021-03-20 NOTE — Significant Event (Signed)
Patient already has a core temperate of 34.9 when arrived to unit. Will apply artic sun pads to maintain temperature at 37 when applicable.

## 2021-03-20 NOTE — Procedures (Addendum)
EEG Report Indication: possible seizure activity--jerking movements after PEA  The duration of the study was 32 minutes.  Electrodes were placed according to the International 10/20 system.  Video was reviewed/available for clinical correlation as needed.  This study was conducted during the comatose non-medically induced versus encephalopathic state.  There was no discernible waking or sleep organization at any point, with no clear anterior-posterior voltage or frequency gradient, posterior dominant rhythm or sleep architecture noted at any point.  The background instead was comprised of a very suppressed background--nearly entirely isoelectric (e.g., "flat line")--Except for suspected artifact, and very brief, <1-2 second periods of bursts of cortical activity which has a highly epileptiform appearance, and which corresponds to diffuse jerking of the trunk/limbs (e.g., cortical myoclonus) on video.  The periods of burst cortical activity are infrequent, with 1-2 minutes at a time of diffuse suppression, and comprise <5-10% at most of the recording.  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  These last <1-2 seconds at most and there are no sustained seizures.  If this pattern is seen in the context of hypoxic injury, without clear metabolic, sedation or temperature related confounders, it is typically thought to be very highly specifically poor prognosis for recovery of consciousness.

## 2021-03-20 NOTE — Progress Notes (Signed)
NAME:  Valerie James, MRN:  [id number], DOB:  December 15, 1971, LOS: 0 ADMISSION DATE:  04/19/2021, CONSULTATION DATE:  04/19/2021 REFERRING MD:  Rancour, CHIEF COMPLAINT: found down, PEA cardiac arrest.   History of Present Illness:  Ms. Valerie James was found unresponsive with agonal breathing by family after being seen normal at 9pm.  On EMS arrival lost pulses, developed PEA, received CPR and 2 doses epinephrine.  King airway placed in the field, then intubated with ET tube in the ED.    In ED received ASA 300 pr, ns 1L, fentanyl and versed started, levophed started.    7.132/88/240 at 5am.   Currently sedated, but apparently was awake with eyes open prior to sedation.   She lives in [place], staying with her daughter Valerie James in [place] right now.   Daughter noticed she wasn't very responsive at 1030pm, was sliding out of bed.  She assumed she was just very sleepy, she also saw some food in her mouth.   She assumed she was drowsy from medications.  She woke up and tried to walk, fell and hit her head several times.   Per daughter, she does have and inhaler, has copd/asthma.  She has had a cough recently.    Smokes 1/2-1ppd per daughter.    Pertinent  Medical History  Obesity Asthma  Significant Hospital Events: Including procedures, antibiotic start and stop dates in addition to other pertinent events   . 4/2 Intubated, admitted to ICU after cardiac arrest  Interim History / Subjective:   covid negative. Temp has been low since admission. Talked with daughter [name] this morning. She has been having somnolence with concern for sleep disordered breathing for a long time. Was having URI symptoms and took cough syrup. Apparently this made her really sleepy and she went to bed. Was difficult to arouse and then found to be agonally breathing when EMS was called.   Objective   Blood pressure 120/81, pulse 65, temperature (!) 96.8 F (36 C), temperature source Bladder, resp. rate (!)  33, height 5\' 9"  (1.753 m), weight (!) 149.3 kg, SpO2 97 %.    Vent Mode: PRVC FiO2 (%):  [60 %-100 %] 70 % Set Rate:  [20 bmp-26 bmp] 26 bmp Vt Set:  [530 mL-560 mL] 530 mL PEEP:  [5 cmH20] 5 cmH20 Plateau Pressure:  [27 cmH20] 27 cmH20   Intake/Output Summary (Last 24 hours) at 2021-04-19 1057 Last data filed at 04-19-21 0900 Gross per 24 hour  Intake 388.9 ml  Output 400 ml  Net -11.1 ml   Filed Weights   2021-04-19 0800  Weight: (!) 149.3 kg    Examination: General: intubated, NAD, obese  HENT: pupils miotic but sluggish Lungs: bilateral rhonchi and expiratory wheezes Cardiovascular: RRR no mgr  Abdomen: no apparent tenderness, nbs, nd Extremities: normal,  Neuro: not responsive. Off fentanyl. Had a couple isolate jerking movemnts  Labs/imaging that I havepersonally reviewed  (right click and "Reselect all SmartList Selections" daily)  ABG with hypercapnia, improved on repeat ABG Lactic acid downtrending procalcitonin is low Hgb high at 16.7, WBC 17.5   Resolved Hospital Problem list     Assessment & Plan:  Valerie James is a 50 y.o. woman with history of asthma and obesity with suspected sleep disordered breathing. She presents with:  PEA cardiac arrest: Does not appear to be ACS. Daughter reports concern for sleep disordered breathing. I suspect she has underlying OHS/OSA, recent URI, and developed worsening hypercapnia and hypoxemia resulting in cardiac arrest.  Will await formal echo Continue TTM and fever prevention.  Will await EEG  Cardiogenic Shock - was on norepinephrine this morning, now down-titrating, lactic acid down trending - likely in setting of cardiac arrest will continue to monitor and await formal echo  Acute on chronic hypoxemic and hypercapnic respiratory failure - Underlying asthma with active tobacco use - underlying OHS/OSA untreated - covid negative  - continue empiric abx for now, ceftriaxone and azithromycin - procalcitonin is low  doubt true bacterial pna - will continue steroids for now - LTVV with lung protective measures   Best practice (right click and "Reselect all SmartList Selections" daily)  Diet:  Tube Feed  Pain/Anxiety/Delirium protocol (if indicated): Yes (RASS goal -2) VAP protocol (if indicated): Yes DVT prophylaxis: Subcutaneous Heparin GI prophylaxis: PPI Glucose control:  SSI No Central venous access:  N/A Arterial line:  N/A Foley:  Yes  Mobility:  bed rest  PT consulted: N/A Last date of multidisciplinary goals of care discussion [updated daughter [name] on 4/2 by phone] Code Status:  full code Disposition: ICU Discussed her care and prognosis with daughter [name].   The patient is critically ill due to respiratory failure, cardiac arrest.  Critical care was necessary to treat or prevent imminent or life-threatening deterioration.  Critical care was time spent personally by me on the following activities: development of treatment plan with patient and/or surrogate as well as nursing, discussions with consultants, evaluation of patient's response to treatment, examination of patient, obtaining history from patient or surrogate, ordering and performing treatments and interventions, ordering and review of laboratory studies, ordering and review of radiographic studies, pulse oximetry, re-evaluation of patient's condition and participation in multidisciplinary rounds.   Critical Care Time devoted to patient care services described in this note is 45 minutes. This time reflects time of care of this signee Charlott Holler . This critical care time does not reflect separately billable procedures or procedure time, teaching time or supervisory time of PA/NP/Med student/Med Resident etc but could involve care discussion time.       Charlott Holler [facility] 03/19/2021 11:17 AM  Pager: see AMION  If no response to pager , please call critical care on call (see  AMION) until 7pm After 7:00 pm call Elink      Labs   CBC: Recent Labs  Lab 04/07/2021 0512 03/27/2021 0520 04/16/2021 0751 04/15/2021 1004  WBC  --   --  17.5*  SEE [id number]  ADDED DIFF  --   NEUTROABS  --   --  14.9*  --   HGB 17.7* 16.7* 15.3*  SEE [id number]  ADDED DIFF 16.7*  HCT 52.0* 49.0* 49.5*  SEE [id number]  ADDED DIFF 49.0*  MCV  --   --  104.7*  SEE [id number]  ADDED DIFF  --   PLT  --   --  289  SEE [id number]  ADDED DIFF  --     Basic Metabolic Panel: Recent Labs  Lab 04/09/2021 0511 04/07/2021 0512 04/17/2021 0520 04/04/2021 0751 03/29/2021 1004  NA 137 140 137  --  141  K 4.2 4.2 4.2  --  4.1  CL 100 101  --   --   --   CO2 23  --   --   --   --   GLUCOSE 225* 228*  --   --   --   BUN 15 16  --   --   --   CREATININE 1.42* 1.20*  --  1.13*  --   CALCIUM 8.2*  --   --   --   --   MG  --   --   --  2.2  --    GFR: Estimated Creatinine Clearance: 92.4 mL/min (A) (by C-G formula based on SCr of 1.13 mg/dL (H)). Recent Labs  Lab 04/06/2021 0511 04/07/2021 0732 03/28/2021 0751  PROCALCITON  --   --  0.10  WBC  --   --  17.5*  SEE [id number]  ADDED DIFF  LATICACIDVEN 7.2* 5.7*  --     Liver Function Tests: Recent Labs  Lab 03/30/2021 0511  AST 58*  ALT 31  ALKPHOS 75  BILITOT 0.6  PROT 7.2  ALBUMIN 3.6   Recent Labs  Lab 04/05/2021 0751  LIPASE 29  AMYLASE 100   Recent Labs  Lab 04/14/2021 0511  AMMONIA 78*    ABG    Component Value Date/Time   PHART 7.322 (L) 04/14/2021 1004   PCO2ART 54.4 (H) 04/17/2021 1004   PO2ART 64 (L) 04/16/2021 1004   HCO3 28.4 (H) 04/06/2021 1004   TCO2 30 04/09/2021 1004   ACIDBASEDEF 3.0 (H) 03/25/2021 0520   O2SAT 91.0 04/06/2021 1004     Coagulation Profile: Recent Labs  Lab 03/27/2021 0511 03/24/2021 0751  INR 1.2 1.1    Cardiac Enzymes: No results for input(s): CKTOTAL, CKMB, CKMBINDEX, TROPONINI in the last 168 hours.  HbA1C: No results found for: HGBA1C  CBG: Recent Labs  Lab 04/10/2021 0603  GLUCAP 141*

## 2021-03-20 NOTE — ED Triage Notes (Signed)
Brought in by [facility] from home, pt was found down by daughter - pt has been feeling sick for the past few days and has been self medicating with OTC meds. Pt became brady and with agonal respirations with EMS then went into PEA/ arrest.    Pt received total of 2 epinephrine. King airway in place with good waveform. CPR of approx ?

## 2021-03-20 NOTE — Progress Notes (Signed)
EEG complete - results pending 

## 2021-03-20 NOTE — Significant Event (Signed)
Patient with increasing jerking. . Notified Dr. Celine Mans and received orders to restart sedation.   Wallis Bamberg, RN

## 2021-03-21 DIAGNOSIS — R570 Cardiogenic shock: Secondary | ICD-10-CM | POA: Diagnosis not present

## 2021-03-21 DIAGNOSIS — Z66 Do not resuscitate: Secondary | ICD-10-CM

## 2021-03-21 DIAGNOSIS — G931 Anoxic brain damage, not elsewhere classified: Secondary | ICD-10-CM

## 2021-03-21 DIAGNOSIS — G253 Myoclonus: Secondary | ICD-10-CM

## 2021-03-21 DIAGNOSIS — J9621 Acute and chronic respiratory failure with hypoxia: Secondary | ICD-10-CM | POA: Diagnosis not present

## 2021-03-21 DIAGNOSIS — J9622 Acute and chronic respiratory failure with hypercapnia: Secondary | ICD-10-CM

## 2021-03-21 DIAGNOSIS — I469 Cardiac arrest, cause unspecified: Secondary | ICD-10-CM | POA: Diagnosis not present

## 2021-03-21 DIAGNOSIS — Z7189 Other specified counseling: Secondary | ICD-10-CM

## 2021-03-21 LAB — CBC
HCT: 46 % (ref 36.0–46.0)
Hemoglobin: 14.6 g/dL (ref 12.0–15.0)
MCH: 31.9 pg (ref 26.0–34.0)
MCHC: 31.7 g/dL (ref 30.0–36.0)
MCV: 100.7 fL — ABNORMAL HIGH (ref 80.0–100.0)
Platelets: 284 10*3/uL (ref 150–400)
RBC: 4.57 MIL/uL (ref 3.87–5.11)
RDW: 13.6 % (ref 11.5–15.5)
WBC: 13.2 10*3/uL — ABNORMAL HIGH (ref 4.0–10.5)
nRBC: 0 % (ref 0.0–0.2)

## 2021-03-21 LAB — COMPREHENSIVE METABOLIC PANEL
ALT: 29 U/L (ref 0–44)
AST: 44 U/L — ABNORMAL HIGH (ref 15–41)
Albumin: 3.1 g/dL — ABNORMAL LOW (ref 3.5–5.0)
Alkaline Phosphatase: 60 U/L (ref 38–126)
Anion gap: 10 (ref 5–15)
BUN: 12 mg/dL (ref 6–20)
CO2: 25 mmol/L (ref 22–32)
Calcium: 8.6 mg/dL — ABNORMAL LOW (ref 8.9–10.3)
Chloride: 104 mmol/L (ref 98–111)
Creatinine, Ser: 0.77 mg/dL (ref 0.44–1.00)
GFR, Estimated: >60 mL/min (ref >60)
Glucose, Bld: 208 mg/dL — ABNORMAL HIGH (ref 70–99)
Potassium: 3.4 mmol/L — ABNORMAL LOW (ref 3.5–5.1)
Sodium: 139 mmol/L (ref 135–145)
Total Bilirubin: 0.2 mg/dL — ABNORMAL LOW (ref 0.3–1.2)
Total Protein: 6.5 g/dL (ref 6.5–8.1)

## 2021-03-21 LAB — TRIGLYCERIDES: Triglycerides: 80 mg/dL (ref <150)

## 2021-03-21 LAB — GLUCOSE, CAPILLARY
Glucose-Capillary: 171 mg/dL — ABNORMAL HIGH (ref 70–99)
Glucose-Capillary: 190 mg/dL — ABNORMAL HIGH (ref 70–99)
Glucose-Capillary: 225 mg/dL — ABNORMAL HIGH (ref 70–99)
Glucose-Capillary: 226 mg/dL — ABNORMAL HIGH (ref 70–99)
Glucose-Capillary: 228 mg/dL — ABNORMAL HIGH (ref 70–99)
Glucose-Capillary: 244 mg/dL — ABNORMAL HIGH (ref 70–99)

## 2021-03-21 LAB — CORTISOL: Cortisol, Plasma: 11.6 ug/dL

## 2021-03-21 LAB — LEGIONELLA PNEUMOPHILA SEROGP 1 UR AG: L. pneumophila Serogp 1 Ur Ag: NEGATIVE

## 2021-03-21 MED ORDER — POTASSIUM CHLORIDE 20 MEQ PO PACK
40.0000 meq | PACK | Freq: Once | ORAL | Status: AC
  Administered 2021-03-21: 40 meq
  Filled 2021-03-21: qty 2

## 2021-03-21 MED ORDER — SODIUM CHLORIDE 0.9 % IV SOLN
2.0000 g | Freq: Every day | INTRAVENOUS | Status: AC
  Administered 2021-03-21 – 2021-03-25 (×5): 2 g via INTRAVENOUS
  Filled 2021-03-21 (×5): qty 2

## 2021-03-21 MED ORDER — DOCUSATE SODIUM 50 MG/5ML PO LIQD
100.0000 mg | Freq: Every day | ORAL | Status: DC
  Administered 2021-03-21 – 2021-03-23 (×3): 100 mg
  Filled 2021-03-21 (×3): qty 10

## 2021-03-21 MED ORDER — POLYETHYLENE GLYCOL 3350 17 G PO PACK
17.0000 g | PACK | Freq: Every day | ORAL | Status: DC
  Administered 2021-03-21 – 2021-03-23 (×3): 17 g
  Filled 2021-03-21 (×3): qty 1

## 2021-03-21 NOTE — Progress Notes (Signed)
Shift summary: weaned sedation, family care conference  N: Uta orientation, no response to pain. Positive weak cough with deep suction, no gag or corneal response. Pupils 78m/fixed. Temp managed with arctic sun at 37c, no shivering. Propofol off since AM, fentanyl gtt titrated down throughout shift.   R: ETT, 70% + 5 PEEP, no vent changes. ronchi with expiratory wheezes and dim in the bases. Scant tan secretions.   CV: NSR, rare PVC. 2+ edema generalized. MAP goal >65 and SBP >90 met with 1-375m/min levophed.   GI: Tube feeds at goal via OGT, no BM, passing gas. BG corrected with SS insulin.   GU: foley in place, adequate UOP.   Skin: no new skin issues. K+ replaced PO this AM. [facility] updated this afternoon. See separate note about care conference. Approx. 15 visitors today, all updated about status and plan of care. Plan for 1400 conference again tomorrow for [place].   Sheyanne Munley RN

## 2021-03-21 NOTE — Progress Notes (Signed)
 (2,500 mcg) Fentanyl gtt wasted at [facility] pyxis/narcotics receptacle at 1413. Witnessed by Lindalou Hose., RN  Sherre Poot., RN

## 2021-03-21 NOTE — Progress Notes (Signed)
NAME:  Valerie James, MRN:  [id number], DOB:  03/19/1971, LOS: 1 ADMISSION DATE:  04-12-2021, CONSULTATION DATE:  12-Apr-2021 REFERRING MD:  Rancour, CHIEF COMPLAINT: found down, PEA cardiac arrest.   History of Present Illness:  Valerie James was found unresponsive with agonal breathing by family after being seen normal at 9pm.  On EMS arrival lost pulses, developed PEA, received CPR and 2 doses epinephrine.  King airway placed in the field, then intubated with ET tube in the ED.    In ED received ASA 300 pr, ns 1L, fentanyl and versed started, levophed started.    7.132/88/240 at 5am.   Currently sedated, but apparently was awake with eyes open prior to sedation.   She lives in [place], staying with her daughter Valerie James in [place] right now.   Daughter noticed she wasn't very responsive at 1030pm, was sliding out of bed.  She assumed she was just very sleepy, she also saw some food in her mouth.   She assumed she was drowsy from medications.  She woke up and tried to walk, fell and hit her head several times.   Per daughter, she does have and inhaler, has copd/asthma.  She has had a cough recently.    Smokes 1/2-1ppd per daughter.    Pertinent  Medical History  Obesity Asthma  Significant Hospital Events: Including procedures, antibiotic start and stop dates in addition to other pertinent events   . 4/2 Intubated, admitted to ICU after cardiac arrest  Interim History / Subjective:   covid negative. Temp has been low since admission. Talked with daughter Valerie James this morning. She has been having somnolence with concern for sleep disordered breathing for a long time. Was having URI symptoms and took cough syrup. Apparently this made her really sleepy and she went to bed. Was difficult to arouse and then found to be agonally breathing when EMS was called.   Objective   Blood pressure 116/60, pulse 84, temperature 98.6 F (37 C), temperature source Bladder, resp. rate (!) 26,  height 5\' 9"  (1.753 m), weight (!) 155.9 kg, SpO2 93 %.    Vent Mode: PRVC FiO2 (%):  [70 %] 70 % Set Rate:  [26 bmp] 26 bmp Vt Set:  [530 mL] 530 mL PEEP:  [5 cmH20] 5 cmH20 Plateau Pressure:  [24 cmH20] 24 cmH20   Intake/Output Summary (Last 24 hours) at 03/21/2021 1431 Last data filed at 03/21/2021 1100 Gross per 24 hour  Intake 2910.38 ml  Output 2020 ml  Net 890.38 ml   Filed Weights   April 12, 2021 0800 03/21/21 0600  Weight: (!) 149.3 kg (!) 155.9 kg    Examination: General: intubated, NAD, obese  HENT: pupils miotic but sluggish Lungs: bilateral rhonchi and expiratory wheezes Cardiovascular: RRR no mgr  Abdomen: no apparent tenderness, nbs, nd Extremities: normal,  Neuro: not responsive. Off fentanyl. Had a couple isolate jerking movemnts  Labs/imaging that I havepersonally reviewed  (right click and "Reselect all SmartList Selections" daily)  ABG with hypercapnia, improved on repeat ABG Lactic acid downtrending procalcitonin is low Hgb high at 16.7, WBC 17.5   Resolved Hospital Problem list     Assessment & Plan:  Valerie James is a 50 y.o. woman with history of asthma and obesity with suspected sleep disordered breathing. She presents with:  PEA cardiac arrest: Does not appear to be ACS. Daughter reports concern for sleep disordered breathing. I suspect she has underlying OHS/OSA, recent URI, and developed worsening hypercapnia and hypoxemia resulting in cardiac arrest.  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 CTPE negative. EEG shows "flat-line" with epileptiform discharges during myoclonic jerks consistent with anoxic encephalopathy Continue TTM and fever prevention.  Had family meeting with patient's 3 children and nephew today in conference room.  They are understandably shocked and upset. She has additional family coming in from [place]. We discussed a very low likelihood of meaningful recovery and that EEG shows anoxic brain injury. They would like some time to  digest and process this news and reconvene tomorrow at 2pm. I recommended a DNR which they agreed to.  I told them tomorrow we will need to talk about what happens if their mother does not wake up from this - they were not ready to discuss that today.  Cardiogenic Shock - 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 CTPE study negative.  - still on norepinephrine, titrate down as tolerated for MAP>65  Acute on chronic hypoxemic and hypercapnic respiratory failure - Underlying asthma with active tobacco use - underlying OHS/OSA untreated - covid negative  - continue empiric abx for now, ceftriaxone and azithromycin - procalcitonin is low doubt true bacterial pna - no bronchospasm, will steroids for now - LTVV with lung protective measures   Best practice (right click and "Reselect all SmartList Selections" daily)  Diet:  Tube Feed  Pain/Anxiety/Delirium protocol (if indicated): Yes (RASS goal -2) VAP protocol (if indicated): Yes DVT prophylaxis: Subcutaneous Heparin GI prophylaxis: PPI Glucose control:  SSI No Central venous access:  N/A Arterial line:  N/A Foley:  Yes  Mobility:  bed rest  PT consulted: N/A Last date of multidisciplinary goals of care discussion [4/3 with patient's 3 children, nephew, chaplain and bedside RN] Code Status:  full code Disposition: ICU   The patient is critically ill due to respiratory failure, cardiac arrest.  Critical care was necessary to treat or prevent imminent or life-threatening deterioration.  Critical care was time spent personally by me on the following activities: development of treatment plan with patient and/or surrogate as well as nursing, discussions with consultants, evaluation of patient's response to treatment, examination of patient, obtaining history from patient or surrogate, ordering and performing treatments and interventions, ordering and review of laboratory studies, ordering and review of radiographic studies, pulse  oximetry, re-evaluation of patient's condition and participation in multidisciplinary rounds.   Critical Care Time devoted to patient care services described in this note is 47 minutes. This time reflects time of care of this signee Charlott Holler . This critical care time does not reflect separately billable procedures or procedure time, teaching time or supervisory time of PA/NP/Med student/Med Resident etc but could involve care discussion time.       Charlott Holler [facility] 03/21/2021 2:31 PM  Pager: see AMION  If no response to pager , please call critical care on call (see AMION) until 7pm After 7:00 pm call Elink      Labs   CBC: Recent Labs  Lab 04/08/2021 0512 03/23/2021 0520 04/15/2021 0751 03/31/2021 1004 03/21/21 0059  WBC  --   --  17.5*  SEE [id number]  ADDED DIFF  --  13.2*  NEUTROABS  --   --  14.9*  --   --   HGB 17.7* 16.7* 15.3*  SEE [id number]  ADDED DIFF 16.7* 14.6  HCT 52.0* 49.0* 49.5*  SEE [id number]  ADDED DIFF 49.0* 46.0  MCV  --   --  104.7*  SEE [id number]  ADDED DIFF  --  100.7*  PLT  --   --  289  SEE [id number]  ADDED DIFF  --  284    Basic Metabolic Panel: Recent Labs  Lab 03/29/2021 0511 03/27/2021 0512 04/02/2021 0520 03/31/2021 0751 04/13/2021 1004 03/21/21 0059  NA 137 140 137  --  141 139  K 4.2 4.2 4.2  --  4.1 3.4*  CL 100 101  --   --   --  104  CO2 23  --   --   --   --  25  GLUCOSE 225* 228*  --   --   --  208*  BUN 15 16  --   --   --  12  CREATININE 1.42* 1.20*  --  1.13*  --  0.77  CALCIUM 8.2*  --   --   --   --  8.6*  MG  --   --   --  2.2  --   --    GFR: Estimated Creatinine Clearance: 134.1 mL/min (by C-G formula based on SCr of 0.77 mg/dL). Recent Labs  Lab 04/15/2021 0511 03/21/2021 0732 04/07/2021 0751 04/17/2021 1141 03/22/2021 1319 03/21/21 0059  PROCALCITON  --   --  0.10  --   --   --   WBC  --   --  17.5*  SEE [id number]  ADDED DIFF  --   --  13.2*  LATICACIDVEN 7.2* 5.7*  --  2.3* 3.4*  --     Liver  Function Tests: Recent Labs  Lab 03/30/2021 0511 03/21/21 0059  AST 58* 44*  ALT 31 29  ALKPHOS 75 60  BILITOT 0.6 0.2*  PROT 7.2 6.5  ALBUMIN 3.6 3.1*   Recent Labs  Lab 04/09/2021 0751  LIPASE 29  AMYLASE 100   Recent Labs  Lab 04/16/2021 0511  AMMONIA 78*    ABG    Component Value Date/Time   PHART 7.322 (L) 04/05/2021 1004   PCO2ART 54.4 (H) 04/10/2021 1004   PO2ART 64 (L) 03/25/2021 1004   HCO3 28.4 (H) 03/25/2021 1004   TCO2 30 03/29/2021 1004   ACIDBASEDEF 3.0 (H) 03/25/2021 0520   O2SAT 91.0 04/17/2021 1004     Coagulation Profile: Recent Labs  Lab 03/30/2021 0511 04/05/2021 0751  INR 1.2 1.1    Cardiac Enzymes: No results for input(s): CKTOTAL, CKMB, CKMBINDEX, TROPONINI in the last 168 hours.  HbA1C: Hgb A1c MFr Bld  Date/Time Value Ref Range Status  03/31/2021 07:51 AM 6.5 (H) 4.8 - 5.6 % Final    Comment:    (NOTE) Pre diabetes:          5.7%-6.4%  Diabetes:              >6.4%  Glycemic control for   <7.0% adults with diabetes     CBG: Recent Labs  Lab 04/16/2021 1927 04/06/2021 2357 03/21/21 0352 03/21/21 0742 03/21/21 1128  GLUCAP 195* 225* 228* 171* 244*

## 2021-03-21 NOTE — Progress Notes (Signed)
I responded to a page from the nurse to provide spiritual support for the patient's family members following a meeting with the doctor. I visited with the family in the consultation room, shared words of comfort and encouragement and led in prayer. We left the consultation room and returned to the patient's room and I provide spiritual care through pastoral presence. I shared that the Chaplain is available to provide additional support as needed and requested.    03/21/21 1000  Clinical Encounter Type  Visited With Patient and family together  Visit Type Spiritual support  Referral From Nurse  Consult/Referral To Chaplain  Spiritual Encounters  Spiritual Needs Prayer;Emotional  Stress Factors  Patient Stress Factors None identified  Family Stress Factors Exhausted    Chaplain Dr Melvyn Novas

## 2021-03-21 NOTE — Progress Notes (Signed)
Family calling in for update, RN gave telephone update to 3 children and reinforced ability to only give updates to one contact. Family requested to speak to MD. Care conference held with MD Celine Mans, this RN, chaplain Casimiro Needle, daughters [facility] and [place], son, and pt nephew.   Discussed likely etiology of arrest, current status, and plan. Family agreed to DNR for now. Will give 24-48 hours for pt to declare status. Family conference setup for 1400 tomorrow with oncoming provider. Family understands if pt is still unresponsive at time of care conference tomorrow, goals of care decision to be made.   Additional family members are driving in from [place] to visit. Family given permission for additional visitors in rotating pairs of two. Sister plans to be here at bedside around 1230, RN gave phone update and reinforced speaking to family in care conference and having one point of contact.   Yuvraj Pfeifer RN

## 2021-03-22 ENCOUNTER — Inpatient Hospital Stay (HOSPITAL_COMMUNITY): Payer: Medicare Other

## 2021-03-22 DIAGNOSIS — I469 Cardiac arrest, cause unspecified: Secondary | ICD-10-CM | POA: Diagnosis not present

## 2021-03-22 DIAGNOSIS — G931 Anoxic brain damage, not elsewhere classified: Secondary | ICD-10-CM

## 2021-03-22 DIAGNOSIS — J9621 Acute and chronic respiratory failure with hypoxia: Secondary | ICD-10-CM | POA: Diagnosis not present

## 2021-03-22 DIAGNOSIS — J9690 Respiratory failure, unspecified, unspecified whether with hypoxia or hypercapnia: Secondary | ICD-10-CM

## 2021-03-22 LAB — BASIC METABOLIC PANEL
Anion gap: 10 (ref 5–15)
BUN: 18 mg/dL (ref 6–20)
CO2: 27 mmol/L (ref 22–32)
Calcium: 8.8 mg/dL — ABNORMAL LOW (ref 8.9–10.3)
Chloride: 106 mmol/L (ref 98–111)
Creatinine, Ser: 0.79 mg/dL (ref 0.44–1.00)
GFR, Estimated: >60 mL/min (ref >60)
Glucose, Bld: 292 mg/dL — ABNORMAL HIGH (ref 70–99)
Potassium: 3.5 mmol/L (ref 3.5–5.1)
Sodium: 143 mmol/L (ref 135–145)

## 2021-03-22 LAB — MAGNESIUM: Magnesium: 2.4 mg/dL (ref 1.7–2.4)

## 2021-03-22 LAB — GLUCOSE, CAPILLARY
Glucose-Capillary: 141 mg/dL — ABNORMAL HIGH (ref 70–99)
Glucose-Capillary: 161 mg/dL — ABNORMAL HIGH (ref 70–99)
Glucose-Capillary: 172 mg/dL — ABNORMAL HIGH (ref 70–99)
Glucose-Capillary: 175 mg/dL — ABNORMAL HIGH (ref 70–99)
Glucose-Capillary: 190 mg/dL — ABNORMAL HIGH (ref 70–99)
Glucose-Capillary: 274 mg/dL — ABNORMAL HIGH (ref 70–99)
Glucose-Capillary: 308 mg/dL — ABNORMAL HIGH (ref 70–99)

## 2021-03-22 LAB — CULTURE, RESPIRATORY W GRAM STAIN: Culture: NORMAL

## 2021-03-22 MED ORDER — INSULIN ASPART 100 UNIT/ML ~~LOC~~ SOLN
4.0000 [IU] | SUBCUTANEOUS | Status: DC
  Administered 2021-03-22 – 2021-03-24 (×10): 4 [IU] via SUBCUTANEOUS

## 2021-03-22 MED ORDER — IPRATROPIUM BROMIDE 0.02 % IN SOLN
RESPIRATORY_TRACT | Status: AC
  Administered 2021-03-22: 0.5 mg
  Filled 2021-03-22: qty 2.5

## 2021-03-22 MED ORDER — BUDESONIDE 0.25 MG/2ML IN SUSP
0.2500 mg | Freq: Two times a day (BID) | RESPIRATORY_TRACT | Status: DC
  Administered 2021-03-22 – 2021-03-25 (×7): 0.25 mg via RESPIRATORY_TRACT
  Filled 2021-03-22 (×8): qty 2

## 2021-03-22 MED ORDER — INSULIN ASPART 100 UNIT/ML ~~LOC~~ SOLN
0.0000 [IU] | SUBCUTANEOUS | Status: DC
  Administered 2021-03-22 (×2): 4 [IU] via SUBCUTANEOUS
  Administered 2021-03-22: 15 [IU] via SUBCUTANEOUS
  Administered 2021-03-23: 4 [IU] via SUBCUTANEOUS
  Administered 2021-03-23 (×2): 3 [IU] via SUBCUTANEOUS
  Administered 2021-03-23 (×2): 4 [IU] via SUBCUTANEOUS
  Administered 2021-03-23 – 2021-03-24 (×3): 3 [IU] via SUBCUTANEOUS

## 2021-03-22 MED ORDER — SENNOSIDES 8.8 MG/5ML PO SYRP
10.0000 mL | ORAL_SOLUTION | Freq: Every day | ORAL | Status: DC
  Administered 2021-03-22 – 2021-03-23 (×2): 10 mL
  Filled 2021-03-22 (×2): qty 10

## 2021-03-22 MED ORDER — METHYLPREDNISOLONE SODIUM SUCC 125 MG IJ SOLR
80.0000 mg | Freq: Every day | INTRAMUSCULAR | Status: DC
  Administered 2021-03-22: 80 mg via INTRAVENOUS
  Filled 2021-03-22 (×2): qty 2

## 2021-03-22 MED ORDER — ALBUTEROL (5 MG/ML) CONTINUOUS INHALATION SOLN
20.0000 mg | INHALATION_SOLUTION | RESPIRATORY_TRACT | Status: DC
  Administered 2021-03-22 (×2): 20 mg via RESPIRATORY_TRACT
  Filled 2021-03-22 (×3): qty 20

## 2021-03-22 MED ORDER — ALBUTEROL (5 MG/ML) CONTINUOUS INHALATION SOLN
20.0000 mg/h | INHALATION_SOLUTION | RESPIRATORY_TRACT | Status: DC
  Administered 2021-03-22: 20 mg/h via RESPIRATORY_TRACT
  Filled 2021-03-22: qty 20

## 2021-03-22 NOTE — Progress Notes (Signed)
Transported from 2H23 to MRI and back with transport, this RN, and RT. Fentanyl gtt stopped for transport, prn dose given before departure and upon return to [facility] room. No complications during transport.   Air cabin crew

## 2021-03-22 NOTE — Progress Notes (Signed)
Long discussion w/ patient's family. RN also in room. This included her two daughters, her son and multiple other family members. All questions answered. They need a little more time for decisions. At this point plan is to continue supportive care. No escalation.  Family to continue to discuss w/ hope to come to final decision Wednesday   Simonne Martinet ACNP-BC [facility] Pulmonary/Critical Care Pager # [phone] OR # [phone] if no answer

## 2021-03-22 NOTE — Progress Notes (Addendum)
NAMECharlayne James, MRN:  [id number], DOB:  February 27, 1971, LOS: 2 ADMISSION DATE:  04-02-2021, CONSULTATION DATE:  02-Apr-2021 REFERRING MD:  Rancour, CHIEF COMPLAINT: found down, PEA cardiac arrest.   History of Present Illness:  64 yof found by family unresponsive w/ agonal breathing.  On EMS arrival lost pulses, developed PEA, received CPR and 2 doses epinephrine.  King airway placed in the field, then intubated with ET tube in the ED.    Pertinent  Medical History  Obesity Asthma  Significant Hospital Events: Including procedures, antibiotic start and stop dates in addition to other pertinent events    4/2 Intubated, admitted to ICU after cardiac arrest. In ED received ASA 300 pr, ns 1L, fentanyl and versed started, levophed started.  COVID neg. EEG "almost entirely very suppressed/essentially "flat line" isoelectric background, except for very brief highly epileptiform bursts of cortical activity which correspond to myoclonus on video". CT brain neg for acute findings. Chronic para sinusitis.  CT chest negative for pulmonary emboli cardiomegaly noted with reflux into the dilated IVC suggesting right heart failure.  Echocardiogram positive McConnell sign RV systolic function mildly reduced RV size moderately to severely enlarged LV function normal with EF 60 to 65% . 4/3 further review w/ family uncovering recent URI symptoms. Long time concern for what sounds like sleep apnea, using cough syrup for symptoms which was making her more sleepy.Made DNR by family.  . 4/4 started on steroids and cont neb. For bronchospasm and inc. PIP. MRI ordered  Interim History / Subjective:  Started on cont neb    Objective   Blood pressure 134/63, pulse (Abnormal) 110, temperature 98.4 F (36.9 C), temperature source Bladder, resp. rate 12, height 5\' 9"  (1.753 m), weight (Abnormal) 155.1 kg, SpO2 98 %.    Vent Mode: PRVC FiO2 (%):  [60 %-70 %] 60 % Set Rate:  [25 bmp-26 bmp] 26 bmp Vt Set:  [530 mL]  530 mL PEEP:  [5 cmH20] 5 cmH20 Plateau Pressure:  [24 cmH20-29 cmH20] 29 cmH20   Intake/Output Summary (Last 24 hours) at 03/22/2021 0845 Last data filed at 03/22/2021 0800 Gross per 24 hour  Intake 2256.6 ml  Output 1575 ml  Net 681.6 ml   Filed Weights   April 02, 2021 0800 03/21/21 0600 03/22/21 0541  Weight: (Abnormal) 149.3 kg (Abnormal) 155.9 kg (Abnormal) 155.1 kg    Examination:  General: This is a 50 year old black female who remains unresponsive on only minimal fentanyl infusion HEENT neck is large mucous membranes moist orally intubated pupils leftward deviated but reactive Neuro: No response to noxious stimulus.  Does have cough, will open eyes with cough, does not track, left word pupillary deviation pupils are reactive Pulmonary: Diffuse bilateral wheezing peak inspiratory pressures in the high 30s Cardiac: No murmur rub or gallop tachycardic Abdomen obese soft hypoactive but present bowel sounds Extremities warm dry with brisk capillary refill GU clear yellow urine Via Foley catheter  Labs/imaging that I havepersonally reviewed  (right click and "Reselect all SmartList Selections" daily)  See below   Resolved Hospital Problem list     Assessment & Plan:    Anoxic brain injury s/p PEA cardiac arrest: looks like was likely 2/2 acute on chronic hypoxic due to acute diastolic HF due to untreated OSA/OHS complicated further by URI.  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 CTPE study negative.  -cort  was less than 20 Plan Cont tele  respiratory support  Titrate Nepi for MAP > 65 Keep even volume status  Acute on chronic hypoxemic and hypercapnic respiratory failure due to acute diastolic HF w/ pulmonary edema, c/b CAP vs aspiration - Underlying asthma with active tobacco use - underlying OHS/OSA untreated -inc PIP today w/ wheezing. No  CXR to review as of yet. Night crew placed on Cont neb Last CXR did show diffuse ASD and ett at [facility]  Plan Cont full Vent support  PCXR now (I suspect some of the PIP issues may be related to ETT placement) VAP bundle PAD protocol RASS goal 0 Change to scheduled BDs q 4 Scheduled solumedrol at 80mg  qd Day 3 azith and rocephin will plan of 5d azith and 7 rocephin   Hyperglycemia w/ poor glycemic control.  Plan ssi change to resistant Add basal dosing  Fluid and electrolyte imbalance: hypokalemia -was replaced. Plan Am chem  Mild leukocytosis Plan Trend   Best practice (right click and "Reselect all SmartList Selections" daily)  Diet:  Tube Feed  If oral type of diet: na Pain/Anxiety/Delirium protocol (if indicated): Yes (RASS goal 0) VAP protocol (if indicated): Yes DVT prophylaxis: Heparin GI prophylaxis: PPI Glucose control:  SSI Yes Central venous access:  N/A Arterial line:  N/A Foley:  Yes, and it is still needed Restraints ACTION; IS/IS NOT: is not Mobility:  bed rest  PT consulted: N/A Blood pressure target: does not have <--delete if not needed Studies pending: MRI Culture data pending: blood culture and sputum Last reviewed culture data:today Antibiotics:ceftriaxone and azithromycin  Antibiotic de-escalation: Not indicated Stop date: has Planned stop date (if determined) stop azith on 4/7 and rocephin on 4/9 Daily labs: not indicated Code Status:  DNR Prognosis: Terminal Last date of multidisciplinary goals of care discussion [planned for today 4/4/. ]  Disposition: ICU  My cct 34 min  02-28-1994 ACNP-BC [facility] Pulmonary/Critical Care Pager # [phone] OR # [phone] if no answer

## 2021-03-22 NOTE — Progress Notes (Signed)
Inpatient Diabetes Program Recommendations  AACE/ADA: New Consensus Statement on Inpatient Glycemic Control (2015)  Target Ranges:  Prepandial:   less than 140 mg/dL      Peak postprandial:   less than 180 mg/dL (1-2 hours)      Critically ill patients:  140 - 180 mg/dL   Results for Valerie James, Valerie James (MRN [id number]) as of 03/22/2021 14:00  Ref. Range 03/22/2021 00:44 03/22/2021 04:39 03/22/2021 08:03 03/22/2021 11:30  Glucose-Capillary Latest Ref Range: 70 - 99 mg/dL 016 (H)  2 units NOVOLOG  175 (H)  3 units NOVOLOG  274 (H)  8 units NOVOLOG  308 (H)  15 units NOVOLOG       Admit: Anoxic brain injury s/p PEA cardiac arrest/ Cardiogenic Shock/ Acute on chronic hypoxemic and hypercapnic respiratory failure due to acute diastolic HF w/ pulmonary edema, c/b CAP vs aspiration  No History of Diabetes notes  Current Orders: Novolog Resistant Correction Scale/ SSI (0-20 units) Q4 hours    Solumedrol 80 mg Daily  Tube feeds running 60cc/hr    MD- Please consider adding Novolog Tube Feed Coverage to help with glucose elevations  Novolog 4 units Q4 hours to start  HOLD if tube feeds HELD for any reason    --Will follow patient during hospitalization--  Ambrose Finland RN, MSN, CDE Diabetes Coordinator Inpatient Glycemic Control Team Team Pager: [phone] (8a-5p)

## 2021-03-22 NOTE — Progress Notes (Signed)
RN called [name] (daughter) to inform of revised visitor policy. Previously, pairs of 2 family members were allowed to rotate visiting while comfort care decision was pending. As of today's care conference, decision for continued support vs. comfort care are being deferred until likely Wednesday, therefore, regular visitor policy is being re-implemented. Exception being made per unit director Nicholos Johns to allow all three children to visit bedside until decision is made (pt has two daughters and one son). Other visitors also made aware as possible to change.   [place] expressed understanding over the phone of the change.   Dalesha Stanback RN

## 2021-03-22 NOTE — Progress Notes (Signed)
eLink Physician-Brief Progress Note Patient Name: Valerie James DOB: 1971-03-21 MRN: [id number]   Date of Service  03/22/2021  HPI/Events of Note  RT called regarding increased peak pressures on the ventilator. Reports that initially had poor air movement bilaterally by auscultation, but after nebulized bronchodilator treatment there is now wheezing audible.   eICU Interventions  Ordered continuous albuterol nebs + solumedrol 80mg  IV daily (first dose STAT).     Intervention Category Major Interventions: Respiratory failure - evaluation and management  Wendie Diskin 03/22/2021, 1:51 AM

## 2021-03-22 NOTE — Progress Notes (Signed)
Shift summary: Brain MRI. family care conference  N: Uta orientation, no response to pain. Positive cough and gag with deep suction, no corneal response. Pupils 76m/fixed. Temp managed with arctic sun at 37c, no shivering. Propofol and fentanyl gtt for pain and vent dyssynchrony. Fentanyl gtt off for MRI, IVP doses given before and after transport.   R: ETT, 50% + 5 PEEP. Ronchi with expiratory wheezes and dim in the bases. Scant secretions. Shortly after return from MRI, pt dyssynchronous with vent, overbreathing into 40s, peak pressures into 70s. RT giving nebs. Resolved with increasing fent gtt and restarting propofol.  CV: NSR, in and out of trigeminy. BMP checked, K+ low, no orders to replace. 2+ edema generalized, worsening from yesterday. MAP goal >65 and SBP >90 met without pressors.   GI: Tube feeds at goal via OGT, no BM, passing gas. BG corrected with SS insulin.   GU: foley in place, adequate UOP.   Skin: no new skin issues. [employer] updated throughout shift. See NP note about care conference. Approx. 25 visitors today for care conference, all updated about status and plan of care as well as changing visitor policy until [place] decided.   Landy Dunnavant RN

## 2021-03-22 NOTE — Progress Notes (Signed)
Patient's with increased peak airway pressures, diminished breath sounds with expiratory wheezes. MD notified and order for 20 mg continuous albuterol placed. Patient currently tolerating well, RT will continue to monitor patient

## 2021-03-22 NOTE — Progress Notes (Signed)
Patient transported to MRI and back 2H23 without any apparent complications.

## 2021-03-22 NOTE — Progress Notes (Signed)
eLink Physician-Brief Progress Note Patient Name: Valerie James DOB: [birth year] MRN: [id number]   Date of Service  03/22/2021  HPI/Events of Note  Bedside RN asking about discontinuing continuous Albuterol nebs order, patient has an order for PRN treatments Q 4 hours.  eICU Interventions  Continuous neb order discontinued.        Thomasene Lot Aide Wojnar 03/22/2021, 10:03 PM

## 2021-03-23 DIAGNOSIS — J9621 Acute and chronic respiratory failure with hypoxia: Secondary | ICD-10-CM | POA: Diagnosis not present

## 2021-03-23 DIAGNOSIS — G931 Anoxic brain damage, not elsewhere classified: Secondary | ICD-10-CM | POA: Diagnosis not present

## 2021-03-23 DIAGNOSIS — Z9911 Dependence on respirator [ventilator] status: Secondary | ICD-10-CM | POA: Diagnosis not present

## 2021-03-23 DIAGNOSIS — I509 Heart failure, unspecified: Secondary | ICD-10-CM

## 2021-03-23 LAB — GLUCOSE, CAPILLARY
Glucose-Capillary: 132 mg/dL — ABNORMAL HIGH (ref 70–99)
Glucose-Capillary: 122 mg/dL — ABNORMAL HIGH (ref 70–99)
Glucose-Capillary: 122 mg/dL — ABNORMAL HIGH (ref 70–99)
Glucose-Capillary: 173 mg/dL — ABNORMAL HIGH (ref 70–99)
Glucose-Capillary: 184 mg/dL — ABNORMAL HIGH (ref 70–99)
Glucose-Capillary: 136 mg/dL — ABNORMAL HIGH (ref 70–99)

## 2021-03-23 LAB — COMPREHENSIVE METABOLIC PANEL
ALT: 30 U/L (ref 0–44)
AST: 74 U/L — ABNORMAL HIGH (ref 15–41)
Albumin: 2.9 g/dL — ABNORMAL LOW (ref 3.5–5.0)
Alkaline Phosphatase: 56 U/L (ref 38–126)
Anion gap: 8 (ref 5–15)
BUN: 20 mg/dL (ref 6–20)
CO2: 27 mmol/L (ref 22–32)
Calcium: 8.8 mg/dL — ABNORMAL LOW (ref 8.9–10.3)
Chloride: 107 mmol/L (ref 98–111)
Creatinine, Ser: 0.62 mg/dL (ref 0.44–1.00)
GFR, Estimated: >60 mL/min (ref >60)
Glucose, Bld: 163 mg/dL — ABNORMAL HIGH (ref 70–99)
Potassium: 3.5 mmol/L (ref 3.5–5.1)
Sodium: 142 mmol/L (ref 135–145)
Total Bilirubin: 0.4 mg/dL (ref 0.3–1.2)
Total Protein: 6.3 g/dL — ABNORMAL LOW (ref 6.5–8.1)

## 2021-03-23 MED ORDER — FUROSEMIDE 10 MG/ML IJ SOLN
40.0000 mg | Freq: Once | INTRAMUSCULAR | Status: AC
  Administered 2021-03-23: 40 mg via INTRAVENOUS
  Filled 2021-03-23: qty 4

## 2021-03-23 MED ORDER — POTASSIUM CHLORIDE 20 MEQ PO PACK
40.0000 meq | PACK | Freq: Every day | ORAL | Status: DC
  Administered 2021-03-23: 40 meq
  Filled 2021-03-23: qty 2

## 2021-03-23 MED ORDER — METHYLPREDNISOLONE SODIUM SUCC 40 MG IJ SOLR
40.0000 mg | Freq: Every day | INTRAMUSCULAR | Status: DC
  Administered 2021-03-23: 40 mg via INTRAVENOUS
  Filled 2021-03-23: qty 1

## 2021-03-23 MED ORDER — POTASSIUM CHLORIDE 20 MEQ PO PACK
40.0000 meq | PACK | Freq: Once | ORAL | Status: AC
  Administered 2021-03-23: 40 meq
  Filled 2021-03-23: qty 2

## 2021-03-23 NOTE — Progress Notes (Signed)
NAMEBurgandy James, MRN:  [id number], DOB:  03-Aug-1971, LOS: 3 ADMISSION DATE:  04-10-21, CONSULTATION DATE:  Apr 10, 2021 REFERRING MD:  Rancour, CHIEF COMPLAINT: found down, PEA cardiac arrest.   History of Present Illness:  35 yof found by family unresponsive w/ agonal breathing.  On EMS arrival lost pulses, developed PEA, received CPR and 2 doses epinephrine.  King airway placed in the field, then intubated with ET tube in the ED.    Pertinent  Medical History  Obesity Asthma  Significant Hospital Events: Including procedures, antibiotic start and stop dates in addition to other pertinent events    4/2 Intubated, admitted to ICU after cardiac arrest. In ED received ASA 300 pr, ns 1L, fentanyl and versed started, levophed started.  COVID neg. EEG "almost entirely very suppressed/essentially "flat line" isoelectric background, except for very brief highly epileptiform bursts of cortical activity which correspond to myoclonus on video". CT brain neg for acute findings. Chronic para sinusitis.  CT chest negative for pulmonary emboli cardiomegaly noted with reflux into the dilated IVC suggesting right heart failure.  Echocardiogram positive McConnell sign RV systolic function mildly reduced RV size moderately to severely enlarged LV function normal with EF 60 to 65% . 4/3 further review w/ family uncovering recent URI symptoms. Long time concern for what sounds like sleep apnea, using cough syrup for symptoms which was making her more sleepy.Made DNR by family.  . 4/4 started on steroids and cont neb. For bronchospasm and inc. PIP. MRI confirmed anoxic injury.  Spoke with family, family deciding on one-way extubation versus trach/PEG/vent SNF . 4/5: No clinical changes awaiting family decision  Interim History / Subjective:  Looks about the same    Objective   Blood pressure (Abnormal) 102/53, pulse (Abnormal) 29, temperature 98.6 F (37 C), temperature source Bladder, resp. rate  (Abnormal) 24, height 5\' 9"  (1.753 m), weight (Abnormal) 155.3 kg, SpO2 96 %.    Vent Mode: PRVC FiO2 (%):  [40 %-60 %] 40 % Set Rate:  [24 bmp-26 bmp] 24 bmp Vt Set:  [530 mL] 530 mL PEEP:  [5 cmH20] 5 cmH20 Plateau Pressure:  [25 cmH20-31 cmH20] 29 cmH20   Intake/Output Summary (Last 24 hours) at 03/23/2021 0900 Last data filed at 03/23/2021 0600 Gross per 24 hour  Intake 2380.05 ml  Output 1600 ml  Net 780.05 ml   Filed Weights   03/21/21 0600 03/22/21 0541 03/23/21 0125  Weight: (Abnormal) 155.9 kg (Abnormal) 155.1 kg (Abnormal) 155.3 kg    Examination:  General 50 year old black female she remains on ventilatory support she is unresponsive, continues to have a cough with suction, still has a deviated gaze HEENT orally intubated neck is large mucous membranes moist purulent drainage from nose Pulmonary: Coarse scattered rhonchi diffusely Cardiac: Regular rhythm Abdomen: Obese soft Extremities: Warm dry GU: Clear yellow via Foley catheter Neuro: No movement of extremities.  Does have cough with deep suction opens eyes to noxious stimulus only  Labs/imaging that I havepersonally reviewed  (right click and "Reselect all SmartList Selections" daily)  See below   Resolved Hospital Problem list   Cardiogenic shock is resolved  Assessment & Plan:    Anoxic brain injury s/p PEA cardiac arrest: Confirmed via MRI I.  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 CTPE study negative. Plan IV diuresis Telemetry  Acute on chronic hypoxemic and hypercapnic respiratory failure due to acute diastolic HF w/ pulmonary edema, c/b CAP vs aspiration (normal  flora) - Underlying asthma with active tobacco use - underlying OHS/OSA untreated -Mental status primary barrier to extubation -Portable chest x-ray from 4/4 Showed endotracheal tube in satisfactory position left basilar airspace disease, patchy bilateral airspace disease  likely element of edema superimposed on pneumonia involving the left base Plan Continue full ventilator support  PAD protocol RASS goal 0  Scheduled bronchodilators  Reduce Solu-Medrol to 40 mg daily  Day #4 of 5 azithromycin and ceftriaxone  Awaiting further family discussion Re: Trach versus one-way extubation  Hyperglycemia w/ poor glycemic control.  Plan Continue current resistant sliding scale insulin as well as every 4 hour tube feed coverage  Fluid and electrolyte imbalance: hypokalemia -was replaced. Plan Potassium replaced earlier, follow-up pending Mild leukocytosis Plan Intermittent CBC Trend fever curve Best practice (right click and "Reselect all SmartList Selections" daily)  Diet:  Tube Feed  If oral type of diet: na Pain/Anxiety/Delirium protocol (if indicated): Yes (RASS goal 0) VAP protocol (if indicated): Yes DVT prophylaxis: Heparin GI prophylaxis: PPI Glucose control:  SSI Yes and Basal insulin Yes Central venous access:  N/A Arterial line:  N/A Foley:  Yes, and it is still needed Restraints ACTION; IS/IS NOT: is not Mobility:  bed rest  PT consulted: N/A Studies pending: none Culture data pending: blood culture  Last reviewed culture data:today  Antibiotics:ceftriaxone and azithromycin  Antibiotic de-escalation: Not indicated Stop date: has Planned stop date (if determined) stop azith on 4/7 and rocephin on 4/7 Daily labs: not indicated Code Status:  DNR Prognosis: Terminal Last date of multidisciplinary goals of care discussion [planned for today 4/4/. ]  Disposition: ICU  My cct 32  Simonne Martinet ACNP-BC [facility] Pulmonary/Critical Care Pager # [phone] OR # [phone] if no answer

## 2021-03-23 NOTE — Progress Notes (Signed)
[facility] ADULT ICU REPLACEMENT PROTOCOL   The patient does apply for the [facility] Adult ICU Electrolyte Replacment Protocol based on the criteria listed below:   1. Is GFR >/= 30 ml/min? Yes.    Patient's GFR today is >60 2. Is SCr </= 2? Yes.   Patient's SCr is 0.62 ml/kg/hr 3. Did SCr increase >/= 0.5 in 24 hours? No. 4. Abnormal electrolyte(s):  K 3.5 5. Ordered repletion with: protocol 6. If a panic level lab has been reported, has the CCM MD in charge been notified? Yes.  .   Physician:  Shawn Stall R Mickenzie Stolar 03/23/2021 5:26 AM

## 2021-03-23 NOTE — Progress Notes (Signed)
Assisted tele visit to patient with family member.  Aniello Christopoulos R Genowefa Morga, RN   

## 2021-03-24 ENCOUNTER — Inpatient Hospital Stay (HOSPITAL_COMMUNITY): Payer: Medicare Other

## 2021-03-24 DIAGNOSIS — I509 Heart failure, unspecified: Secondary | ICD-10-CM | POA: Diagnosis not present

## 2021-03-24 DIAGNOSIS — Z9911 Dependence on respirator [ventilator] status: Secondary | ICD-10-CM | POA: Diagnosis not present

## 2021-03-24 DIAGNOSIS — G931 Anoxic brain damage, not elsewhere classified: Secondary | ICD-10-CM | POA: Diagnosis not present

## 2021-03-24 DIAGNOSIS — J9621 Acute and chronic respiratory failure with hypoxia: Secondary | ICD-10-CM | POA: Diagnosis not present

## 2021-03-24 LAB — TRIGLYCERIDES: Triglycerides: 96 mg/dL (ref <150)

## 2021-03-24 LAB — CBC WITH DIFFERENTIAL/PLATELET
Abs Immature Granulocytes: 0.04 10*3/uL (ref 0.00–0.07)
Basophils Absolute: 0 10*3/uL (ref 0.0–0.1)
Basophils Relative: 0 %
Eosinophils Absolute: 0 10*3/uL (ref 0.0–0.5)
Eosinophils Relative: 0 %
HCT: 43.5 % (ref 36.0–46.0)
Hemoglobin: 13.8 g/dL (ref 12.0–15.0)
Immature Granulocytes: 0 %
Lymphocytes Relative: 15 %
Lymphs Abs: 1.8 10*3/uL (ref 0.7–4.0)
MCH: 31.7 pg (ref 26.0–34.0)
MCHC: 31.7 g/dL (ref 30.0–36.0)
MCV: 100 fL (ref 80.0–100.0)
Monocytes Absolute: 0.9 10*3/uL (ref 0.1–1.0)
Monocytes Relative: 7 %
Neutro Abs: 9.2 10*3/uL — ABNORMAL HIGH (ref 1.7–7.7)
Neutrophils Relative %: 78 %
Platelets: 269 10*3/uL (ref 150–400)
RBC: 4.35 MIL/uL (ref 3.87–5.11)
RDW: 14.6 % (ref 11.5–15.5)
WBC: 12 10*3/uL — ABNORMAL HIGH (ref 4.0–10.5)
nRBC: 0 % (ref 0.0–0.2)

## 2021-03-24 LAB — URINALYSIS, ROUTINE W REFLEX MICROSCOPIC
Bacteria, UA: NONE SEEN
Bilirubin Urine: NEGATIVE
Glucose, UA: NEGATIVE mg/dL
Ketones, ur: NEGATIVE mg/dL
Leukocytes,Ua: NEGATIVE
Nitrite: NEGATIVE
Protein, ur: NEGATIVE mg/dL
RBC / HPF: >50 RBC/hpf — ABNORMAL HIGH (ref 0–5)
Specific Gravity, Urine: 1.025 (ref 1.005–1.030)
pH: 6 (ref 5.0–8.0)

## 2021-03-24 LAB — COMPREHENSIVE METABOLIC PANEL
ALT: 74 U/L — ABNORMAL HIGH (ref 0–44)
AST: 118 U/L — ABNORMAL HIGH (ref 15–41)
Albumin: 2.8 g/dL — ABNORMAL LOW (ref 3.5–5.0)
Alkaline Phosphatase: 57 U/L (ref 38–126)
Anion gap: 8 (ref 5–15)
BUN: 23 mg/dL — ABNORMAL HIGH (ref 6–20)
CO2: 30 mmol/L (ref 22–32)
Calcium: 8.8 mg/dL — ABNORMAL LOW (ref 8.9–10.3)
Chloride: 101 mmol/L (ref 98–111)
Creatinine, Ser: 0.52 mg/dL (ref 0.44–1.00)
GFR, Estimated: >60 mL/min (ref >60)
Glucose, Bld: 167 mg/dL — ABNORMAL HIGH (ref 70–99)
Potassium: 4 mmol/L (ref 3.5–5.1)
Sodium: 139 mmol/L (ref 135–145)
Total Bilirubin: 0.8 mg/dL (ref 0.3–1.2)
Total Protein: 6.5 g/dL (ref 6.5–8.1)

## 2021-03-24 LAB — GLUCOSE, CAPILLARY
Glucose-Capillary: 126 mg/dL — ABNORMAL HIGH (ref 70–99)
Glucose-Capillary: 120 mg/dL — ABNORMAL HIGH (ref 70–99)
Glucose-Capillary: 123 mg/dL — ABNORMAL HIGH (ref 70–99)
Glucose-Capillary: 137 mg/dL — ABNORMAL HIGH (ref 70–99)
Glucose-Capillary: 145 mg/dL — ABNORMAL HIGH (ref 70–99)

## 2021-03-24 LAB — ABO/RH
ABO/RH(D): A POS
PT AG Type: POSITIVE

## 2021-03-24 LAB — PHOSPHORUS: Phosphorus: 3.5 mg/dL (ref 2.5–4.6)

## 2021-03-24 LAB — PROTIME-INR
INR: 1.1 (ref 0.8–1.2)
Prothrombin Time: 13.6 seconds (ref 11.4–15.2)

## 2021-03-24 LAB — TYPE AND SCREEN
ABO/RH(D): A POS
Antibody Screen: NEGATIVE
PT AG Type: POSITIVE

## 2021-03-24 LAB — BASIC METABOLIC PANEL
Anion gap: 9 (ref 5–15)
BUN: 21 mg/dL — ABNORMAL HIGH (ref 6–20)
CO2: 31 mmol/L (ref 22–32)
Calcium: 9 mg/dL (ref 8.9–10.3)
Chloride: 100 mmol/L (ref 98–111)
Creatinine, Ser: 0.51 mg/dL (ref 0.44–1.00)
GFR, Estimated: >60 mL/min (ref >60)
Glucose, Bld: 119 mg/dL — ABNORMAL HIGH (ref 70–99)
Potassium: 4.4 mmol/L (ref 3.5–5.1)
Sodium: 140 mmol/L (ref 135–145)

## 2021-03-24 LAB — BILIRUBIN, DIRECT: Bilirubin, Direct: 0.2 mg/dL (ref 0.0–0.2)

## 2021-03-24 LAB — APTT: aPTT: 27 seconds (ref 24–36)

## 2021-03-24 LAB — SARS CORONAVIRUS 2 (TAT 6-24 HRS): SARS Coronavirus 2: NEGATIVE

## 2021-03-24 LAB — FIBRINOGEN: Fibrinogen: 581 mg/dL — ABNORMAL HIGH (ref 210–475)

## 2021-03-24 LAB — MAGNESIUM: Magnesium: 1.9 mg/dL (ref 1.7–2.4)

## 2021-03-24 MED ORDER — ATROPINE SULFATE 1 % OP SOLN
2.0000 [drp] | Freq: Four times a day (QID) | OPHTHALMIC | Status: DC
  Filled 2021-03-24: qty 2

## 2021-03-24 MED ORDER — POLYVINYL ALCOHOL 1.4 % OP SOLN
1.0000 [drp] | OPHTHALMIC | Status: DC | PRN
  Administered 2021-03-25 (×2): 1 [drp] via OPHTHALMIC
  Filled 2021-03-24: qty 15

## 2021-03-24 MED ORDER — FENTANYL CITRATE (PF) 100 MCG/2ML IJ SOLN: 50.0000 ug | INTRAMUSCULAR | Status: DC | PRN

## 2021-03-24 MED ORDER — DEXTROSE 5 % IV SOLN: INTRAVENOUS | Status: DC

## 2021-03-24 MED ORDER — POLYVINYL ALCOHOL 1.4 % OP SOLN
1.0000 [drp] | Freq: Four times a day (QID) | OPHTHALMIC | Status: DC | PRN
  Filled 2021-03-24: qty 15

## 2021-03-24 MED ORDER — MIDAZOLAM HCL 2 MG/2ML IJ SOLN
2.0000 mg | INTRAMUSCULAR | Status: DC | PRN
  Administered 2021-03-24: 2 mg via INTRAVENOUS
  Filled 2021-03-24: qty 2

## 2021-03-24 MED ORDER — ACETAMINOPHEN 325 MG PO TABS: 650.0000 mg | ORAL_TABLET | Freq: Four times a day (QID) | ORAL | Status: DC | PRN

## 2021-03-24 MED ORDER — WHITE PETROLATUM EX OINT
TOPICAL_OINTMENT | CUTANEOUS | Status: AC
  Administered 2021-03-24: 0.2 via TOPICAL
  Filled 2021-03-24: qty 28.35

## 2021-03-24 MED ORDER — WHITE PETROLATUM EX OINT: TOPICAL_OINTMENT | CUTANEOUS | Status: DC | PRN

## 2021-03-24 MED ORDER — INSULIN ASPART 100 UNIT/ML ~~LOC~~ SOLN
0.0000 [IU] | SUBCUTANEOUS | Status: DC
  Administered 2021-03-24 – 2021-03-25 (×6): 2 [IU] via SUBCUTANEOUS

## 2021-03-24 MED ORDER — POLYETHYLENE GLYCOL 3350 17 G PO PACK
17.0000 g | PACK | Freq: Every day | ORAL | Status: DC
  Administered 2021-03-24 – 2021-03-25 (×2): 17 g
  Filled 2021-03-24 (×2): qty 1

## 2021-03-24 MED ORDER — DIPHENHYDRAMINE HCL 50 MG/ML IJ SOLN: 25.0000 mg | INTRAMUSCULAR | Status: DC | PRN

## 2021-03-24 MED ORDER — PANTOPRAZOLE SODIUM 40 MG PO PACK
40.0000 mg | PACK | Freq: Every day | ORAL | Status: DC
  Administered 2021-03-24 – 2021-03-25 (×2): 40 mg
  Filled 2021-03-24 (×2): qty 20

## 2021-03-24 MED ORDER — SODIUM CHLORIDE 0.9 % IV BOLUS
1000.0000 mL | Freq: Once | INTRAVENOUS | Status: AC
  Administered 2021-03-24: 1000 mL via INTRAVENOUS

## 2021-03-24 MED ORDER — FENTANYL CITRATE (PF) 100 MCG/2ML IJ SOLN
100.0000 ug | INTRAMUSCULAR | Status: DC | PRN
Start: 2021-03-24 — End: 2021-03-24
  Administered 2021-03-24: 100 ug via INTRAVENOUS
  Filled 2021-03-24: qty 2

## 2021-03-24 MED ORDER — DOCUSATE SODIUM 50 MG/5ML PO LIQD
100.0000 mg | Freq: Two times a day (BID) | ORAL | Status: DC
  Administered 2021-03-24 – 2021-03-25 (×2): 100 mg
  Filled 2021-03-24 (×2): qty 10

## 2021-03-24 MED ORDER — GLYCOPYRROLATE 0.2 MG/ML IJ SOLN: 0.2000 mg | INTRAMUSCULAR | Status: DC | PRN

## 2021-03-24 MED ORDER — HEPARIN SODIUM (PORCINE) 5000 UNIT/ML IJ SOLN
5000.0000 [IU] | Freq: Three times a day (TID) | INTRAMUSCULAR | Status: DC
  Administered 2021-03-24 – 2021-03-26 (×6): 5000 [IU] via SUBCUTANEOUS
  Filled 2021-03-24 (×6): qty 1

## 2021-03-24 MED ORDER — ACETAMINOPHEN 650 MG RE SUPP: 650.0000 mg | Freq: Four times a day (QID) | RECTAL | Status: DC | PRN

## 2021-03-24 MED ORDER — PROPOFOL 1000 MG/100ML IV EMUL
INTRAVENOUS | Status: AC
  Administered 2021-03-24: 10 ug/kg/min via INTRAVENOUS
  Filled 2021-03-24: qty 100

## 2021-03-24 MED ORDER — PROPOFOL 1000 MG/100ML IV EMUL
0.0000 ug/kg/min | INTRAVENOUS | Status: DC
  Administered 2021-03-24: 15 ug/kg/min via INTRAVENOUS
  Administered 2021-03-24: 20 ug/kg/min via INTRAVENOUS
  Administered 2021-03-25 (×2): 30 ug/kg/min via INTRAVENOUS
  Administered 2021-03-25: 20 ug/kg/min via INTRAVENOUS
  Administered 2021-03-25: 40 ug/kg/min via INTRAVENOUS
  Administered 2021-03-25: 20 ug/kg/min via INTRAVENOUS
  Administered 2021-03-26: 40 ug/kg/min via INTRAVENOUS
  Administered 2021-03-26: 30 ug/kg/min via INTRAVENOUS
  Administered 2021-03-26: 40 ug/kg/min via INTRAVENOUS
  Administered 2021-03-26: 30 ug/kg/min via INTRAVENOUS
  Filled 2021-03-24 (×4): qty 100
  Filled 2021-03-24: qty 200
  Filled 2021-03-24 (×3): qty 100
  Filled 2021-03-24: qty 200

## 2021-03-24 MED ORDER — GLYCOPYRROLATE 1 MG PO TABS
1.0000 mg | ORAL_TABLET | ORAL | Status: DC | PRN
  Filled 2021-03-24: qty 1

## 2021-03-24 NOTE — Progress Notes (Signed)
I have had a long discussion w/ the patient's family. They are all in agreement to proceed with extubation. They are hopeful that post-extubation they will have time with her. They are under the assumption we are going to proceed with this. I have just learned that [facility] would like a chance to approach the family. Will pause extubation until they have had a chance to do so but I am very concerned for the impact this will have on the family who are already struggling as they are coming to terms.  Plan Cont current support One-way extubation  PRN analgesia   Simonne Martinet ACNP-BC [facility] Pulmonary/Critical Care Pager # [phone] OR # [phone] if no answer

## 2021-03-24 NOTE — Procedures (Signed)
Arterial Catheter Insertion Procedure Note  Valerie James  [id number]  January 07, 1971  Date:03/24/21  Time:12:22 PM    Provider Performing: Darolyn Rua    Procedure: Insertion of Arterial Line (18563) without US guidance  Indication(s) Blood pressure monitoring and/or need for frequent ABGs  Consent Risks of the procedure as well as the alternatives and risks of each were explained to the patient and/or caregiver.  Consent for the procedure was obtained and is signed in the bedside chart  Anesthesia None   Time Out Verified patient identification, verified procedure, site/side was marked, verified correct patient position, special equipment/implants available, medications/allergies/relevant history reviewed, required imaging and test results available.   Sterile Technique Maximal sterile technique including full sterile barrier drape, hand hygiene, sterile gown, sterile gloves, mask, hair covering, sterile ultrasound probe cover (if used).   Procedure Description Area of catheter insertion was cleaned with chlorhexidine and draped in sterile fashion. Without real-time ultrasound guidance an arterial catheter was placed into the left radial artery.  Appropriate arterial tracings confirmed on monitor.     Complications/Tolerance None; patient tolerated the procedure well.   EBL Minimal   Specimen(s) None

## 2021-03-24 NOTE — Progress Notes (Signed)
Patient transported to CT and back to 2H23 without any apparent complications.

## 2021-03-24 NOTE — Progress Notes (Signed)
eLink Physician-Brief Progress Note Patient Name: Valerie James DOB: [birth year] MRN: [id number]   Date of Service  03/24/2021  HPI/Events of Note  Oliguria - LVEF = 60-65%.  eICU Interventions  Plan: 1. Bolus with 0.9 NaCl 1 liter IV over 1 hour now.      Intervention Category Major Interventions: Other:  Lenell Antu 03/24/2021, 10:27 PM

## 2021-03-24 NOTE — Progress Notes (Signed)
NAMEOneisha Ammons, MRN:  [id number], DOB:  1971-05-20, LOS: 4 ADMISSION DATE:  03-29-21, CONSULTATION DATE:  03-29-2021 REFERRING MD:  Rancour, CHIEF COMPLAINT: found down, PEA cardiac arrest.   History of Present Illness:  50 yof found by family unresponsive w/ agonal breathing.  On EMS arrival lost pulses, developed PEA, received CPR and 2 doses epinephrine.  King airway placed in the field, then intubated with ET tube in the ED.    Pertinent  Medical History  Obesity Asthma  Significant Hospital Events: Including procedures, antibiotic start and stop dates in addition to other pertinent events    4/2 Intubated, admitted to ICU after cardiac arrest. In ED received ASA 300 pr, ns 1L, fentanyl and versed started, levophed started.  COVID neg. EEG "almost entirely very suppressed/essentially "flat line" isoelectric background, except for very brief highly epileptiform bursts of cortical activity which correspond to myoclonus on video". CT brain neg for acute findings. Chronic para sinusitis.  CT chest negative for pulmonary emboli cardiomegaly noted with reflux into the dilated IVC suggesting right heart failure.  Echocardiogram positive McConnell sign RV systolic function mildly reduced RV size moderately to severely enlarged LV function normal with EF 60 to 65% . 4/3 further review w/ family uncovering recent URI symptoms. Long time concern for what sounds like sleep apnea, using cough syrup for symptoms which was making her more sleepy.Made DNR by family.  . 4/4 started on steroids and cont neb. For bronchospasm and inc. PIP. MRI confirmed anoxic injury.  Spoke with family, family deciding on one-way extubation versus trach/PEG/vent SNF . 4/5: No clinical changes awaiting family decision  Interim History / Subjective:  No change.  I did note ability to spontaneously ventilate on bedside eval    Objective   Blood pressure 111/62, pulse 70, temperature 98.6 F (37 C), temperature  source Bladder, resp. rate (Abnormal) 24, height 5' 9" (1.753 m), weight (Abnormal) 155.3 kg, SpO2 94 %.    Vent Mode: PRVC FiO2 (%):  [40 %] 40 % Set Rate:  [24 bmp] 24 bmp Vt Set:  [530 mL] 530 mL PEEP:  [5 cmH20] 5 cmH20 Plateau Pressure:  [24 cmH20-29 cmH20] 24 cmH20   Intake/Output Summary (Last 24 hours) at 03/24/2021 0709 Last data filed at 03/24/2021 0600 Gross per 24 hour  Intake 1821.01 ml  Output 4425 ml  Net -2603.99 ml   Filed Weights   03/22/21 0541 03/23/21 0125 03/24/21 0351  Weight: (Abnormal) 155.1 kg (Abnormal) 155.3 kg (Abnormal) 155.3 kg    Examination:  General this is an obese 50 year old female who remains on full ventilatory support status post cardiac arrest HEENT orally intubated orogastric tube in place pupils reactive leftward deviated Pulmonary coarse scattered rhonchi currently on full ventilatory support Cardiac regular rate and rhythm Abdomen obese soft positive bowel sound tolerating tube feeds Extremities dependent edema brisk cap refill warm Neuro will open eyes with a cough, does not follow commands, coughs to deep suction only GU clear yellow urine  Labs/imaging that I havepersonally reviewed  (right click and "Reselect all SmartList Selections" daily)  See below   Resolved Hospital Problem list   Cardiogenic shock is resolved  Assessment & Plan:    Anoxic brain injury s/p PEA cardiac arrest: Confirmed via MRI I.  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 CTPE study negative. Plan IV diuresis  Cont tele   Acute on chronic hypoxemic and hypercapnic respiratory failure  due to acute diastolic HF w/ pulmonary edema, c/b CAP vs aspiration (normal flora) - Underlying asthma with active tobacco use - underlying OHS/OSA untreated -Mental status primary barrier to extubation Plan Cont full vent support PAD protocol RASS goal 0 Cont BDs Cont solumedrol 40mg /d (planning of 3 more  days) Day 5 of 5 rocephin and azithro Will be meeting w/ family this am to determine further goals of care  Hyperglycemia w/ poor glycemic control.  Plan Cont resistant ssi and every 4 hrs coverage  Fluid and electrolyte imbalance Plan Intermittent checks  Mild leukocytosis Plan Trending fever  Best practice (right click and "Reselect all SmartList Selections" daily)  Diet:  Tube Feed  If oral type of diet: na Pain/Anxiety/Delirium protocol (if indicated): Yes (RASS goal 0) VAP protocol (if indicated): Yes DVT prophylaxis: Heparin GI prophylaxis: PPI Glucose control:  SSI Yes and Basal insulin Yes Central venous access:  N/A Arterial line:  N/A Foley:  Yes, and it is still needed Restraints ACTION; IS/IS NOT: is not Mobility:  bed rest  PT consulted: N/A Studies pending: none Culture data pending: blood culture  Last reviewed culture data:today  Antibiotics:ceftriaxone and azithromycin  Antibiotic de-escalation: Not indicated Stop date: has Planned stop date (if determined) stop azith on 4/7 and rocephin on 4/7 Daily labs: not indicated Code Status:  DNR Prognosis: Terminal Last date of multidisciplinary goals of care discussion [planned for today 4/4/. ]  Disposition: ICU  Critical care time 32 minutes  02-28-1994 ACNP-BC [facility] Pulmonary/Critical Care Pager # [phone] OR # [phone] if no answer family decision  Interim History / Subjective:  No change.  I did note ability to spontaneously ventilate on bedside eval    Objective   Blood pressure 111/62, pulse 70, temperature 98.6 F (37 C), temperature  source Bladder, resp. rate (Abnormal) 24, height 5\' 9"  (1.753 m), weight (Abnormal) 155.3 kg, SpO2 94 %.    Vent Mode: PRVC FiO2 (%):  [40 %] 40 % Set Rate:  [24 bmp] 24 bmp Vt Set:  [530 mL] 530 mL PEEP:  [5 cmH20] 5 cmH20 Plateau Pressure:  [24 cmH20-29 cmH20] 24 cmH20   Intake/Output Summary (Last 24 hours) at 03/24/2021 0709 Last data filed at 03/24/2021 0600 Gross per 24 hour  Intake 1821.01 ml  Output 4425 ml  Net -2603.99 ml   Filed Weights   03/22/21 0541 03/23/21 0125 03/24/21 0351  Weight: (Abnormal) 155.1 kg (Abnormal) 155.3 kg (Abnormal) 155.3 kg    Examination:  General this is an obese 50 year old female who remains on full ventilatory support status post cardiac arrest HEENT orally intubated orogastric tube in place pupils reactive leftward deviated Pulmonary coarse scattered rhonchi currently on full ventilatory support Cardiac regular rate and rhythm Abdomen obese soft positive bowel sound tolerating tube feeds Extremities dependent edema brisk cap refill warm Neuro will open eyes with a cough, does not follow commands, coughs to deep suction only GU clear yellow urine  Labs/imaging that I havepersonally reviewed  (right click and "Reselect all SmartList Selections" daily)  See below   Resolved Hospital Problem list   Cardiogenic shock is resolved  Assessment & Plan:    Anoxic brain injury s/p PEA cardiac arrest: Confirmed via MRI I.  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 CTPE study negative. Plan IV diuresis  Cont tele   Acute on chronic hypoxemic and hypercapnic respiratory failure  due to acute diastolic HF w/ pulmonary edema, c/b CAP vs aspiration (normal flora) - Underlying asthma with active tobacco use - underlying OHS/OSA untreated -Mental status primary barrier to extubation Plan Cont full vent support PAD protocol RASS goal 0 Cont BDs Cont solumedrol 40mg /d (planning of 3 more  days) Day 5 of 5 rocephin and azithro Will be meeting w/ family this am to determine further goals of care  Hyperglycemia w/ poor glycemic control.  Plan Cont resistant ssi and every 4 hrs coverage  Fluid and electrolyte imbalance Plan Intermittent checks  Mild leukocytosis Plan Trending fever  Best practice (right click and "Reselect all SmartList Selections" daily)  Diet:  Tube Feed  If oral type of diet: na Pain/Anxiety/Delirium protocol (if indicated): Yes (RASS goal 0) VAP protocol (if indicated): Yes DVT prophylaxis: Heparin GI prophylaxis: PPI Glucose control:  SSI Yes and Basal insulin Yes Central venous access:  N/A Arterial line:  N/A Foley:  Yes, and it is still needed Restraints ACTION; IS/IS NOT: is not Mobility:  bed rest  PT consulted: N/A Studies pending: none Culture data pending: blood culture  Last reviewed culture data:today  Antibiotics:ceftriaxone and azithromycin  Antibiotic de-escalation: Not indicated Stop date: has Planned stop date (if determined) stop azith on 4/7 and rocephin on 4/7 Daily labs: not indicated Code Status:  DNR Prognosis: Terminal Last date of multidisciplinary goals of care discussion [planned for today 4/4/. ]  Disposition: ICU  Critical care time 32 minutes  02-28-1994 ACNP-BC [facility] Pulmonary/Critical Care Pager # [phone] OR # [phone] if no answer

## 2021-03-25 ENCOUNTER — Inpatient Hospital Stay (HOSPITAL_COMMUNITY): Payer: Medicare Other

## 2021-03-25 DIAGNOSIS — J9621 Acute and chronic respiratory failure with hypoxia: Secondary | ICD-10-CM | POA: Diagnosis not present

## 2021-03-25 DIAGNOSIS — G931 Anoxic brain damage, not elsewhere classified: Secondary | ICD-10-CM | POA: Diagnosis not present

## 2021-03-25 DIAGNOSIS — I469 Cardiac arrest, cause unspecified: Secondary | ICD-10-CM | POA: Diagnosis not present

## 2021-03-25 LAB — SURGICAL PCR SCREEN
MRSA, PCR: NEGATIVE
Staphylococcus aureus: POSITIVE — AB

## 2021-03-25 LAB — URINE CULTURE: Culture: NO GROWTH

## 2021-03-25 LAB — CBC
HCT: 41.8 % (ref 36.0–46.0)
Hemoglobin: 13.5 g/dL (ref 12.0–15.0)
MCH: 32.1 pg (ref 26.0–34.0)
MCHC: 32.3 g/dL (ref 30.0–36.0)
MCV: 99.5 fL (ref 80.0–100.0)
Platelets: 285 10*3/uL (ref 150–400)
RBC: 4.2 MIL/uL (ref 3.87–5.11)
RDW: 14.4 % (ref 11.5–15.5)
WBC: 8.8 10*3/uL (ref 4.0–10.5)
nRBC: 0 % (ref 0.0–0.2)

## 2021-03-25 LAB — COMPREHENSIVE METABOLIC PANEL
ALT: 64 U/L — ABNORMAL HIGH (ref 0–44)
AST: 92 U/L — ABNORMAL HIGH (ref 15–41)
Albumin: 2.7 g/dL — ABNORMAL LOW (ref 3.5–5.0)
Alkaline Phosphatase: 51 U/L (ref 38–126)
Anion gap: 8 (ref 5–15)
BUN: 21 mg/dL — ABNORMAL HIGH (ref 6–20)
CO2: 28 mmol/L (ref 22–32)
Calcium: 8.6 mg/dL — ABNORMAL LOW (ref 8.9–10.3)
Chloride: 103 mmol/L (ref 98–111)
Creatinine, Ser: 0.41 mg/dL — ABNORMAL LOW (ref 0.44–1.00)
GFR, Estimated: >60 mL/min (ref >60)
Glucose, Bld: 123 mg/dL — ABNORMAL HIGH (ref 70–99)
Potassium: 3.8 mmol/L (ref 3.5–5.1)
Sodium: 139 mmol/L (ref 135–145)
Total Bilirubin: 0.5 mg/dL (ref 0.3–1.2)
Total Protein: 5.9 g/dL — ABNORMAL LOW (ref 6.5–8.1)

## 2021-03-25 LAB — GLUCOSE, CAPILLARY
Glucose-Capillary: 108 mg/dL — ABNORMAL HIGH (ref 70–99)
Glucose-Capillary: 115 mg/dL — ABNORMAL HIGH (ref 70–99)
Glucose-Capillary: 116 mg/dL — ABNORMAL HIGH (ref 70–99)
Glucose-Capillary: 122 mg/dL — ABNORMAL HIGH (ref 70–99)
Glucose-Capillary: 127 mg/dL — ABNORMAL HIGH (ref 70–99)
Glucose-Capillary: 127 mg/dL — ABNORMAL HIGH (ref 70–99)
Glucose-Capillary: 134 mg/dL — ABNORMAL HIGH (ref 70–99)

## 2021-03-25 LAB — CULTURE, BLOOD (ROUTINE X 2)
Culture: NO GROWTH
Culture: NO GROWTH
Culture: NO GROWTH
Special Requests: ADEQUATE

## 2021-03-25 LAB — MAGNESIUM: Magnesium: 1.9 mg/dL (ref 1.7–2.4)

## 2021-03-25 LAB — TRIGLYCERIDES: Triglycerides: 181 mg/dL — ABNORMAL HIGH (ref <150)

## 2021-03-25 MED ORDER — MUPIROCIN 2 % EX OINT
1.0000 "application " | TOPICAL_OINTMENT | Freq: Two times a day (BID) | CUTANEOUS | Status: DC
  Administered 2021-03-25 (×2): 1 via NASAL
  Filled 2021-03-25: qty 22

## 2021-03-25 MED ORDER — ALBUMIN HUMAN 25 % IV SOLN
12.5000 g | Freq: Once | INTRAVENOUS | Status: AC
  Administered 2021-03-25: 12.5 g via INTRAVENOUS
  Filled 2021-03-25: qty 50

## 2021-03-25 NOTE — Progress Notes (Signed)
NAMEOrlanda James, MRN:  [id number], DOB:  May 20, 1971, LOS: 5 ADMISSION DATE:  04/03/2021, CONSULTATION DATE:  04-03-2021 REFERRING MD:  Rancour, CHIEF COMPLAINT: found down, PEA cardiac arrest.   History of Present Illness:  23 yof found by family unresponsive w/ agonal breathing.  On EMS arrival lost pulses, developed PEA, received CPR and 2 doses epinephrine.  King airway placed in the field, then intubated with ET tube in the ED.    Pertinent  Medical History  Obesity Asthma  Significant Hospital Events: Including procedures, antibiotic start and stop dates in addition to other pertinent events    4/2 Intubated, admitted to ICU after cardiac arrest. In ED received ASA 300 pr, ns 1L, fentanyl and versed started, levophed started.  COVID neg. EEG "almost entirely very suppressed/essentially "flat line" isoelectric background, except for very brief highly epileptiform bursts of cortical activity which correspond to myoclonus on video". CT brain neg for acute findings. Chronic para sinusitis.  CT chest negative for pulmonary emboli cardiomegaly noted with reflux into the dilated IVC suggesting right heart failure.  Echocardiogram positive McConnell sign RV systolic function mildly reduced RV size moderately to severely enlarged LV function normal with EF 60 to 65% . 4/3 further review w/ family uncovering recent URI symptoms. Long time concern for what sounds like sleep apnea, using cough syrup for symptoms which was making her more sleepy.Made DNR by family.  . 4/4 started on steroids and cont neb. For bronchospasm and inc. PIP. MRI confirmed anoxic injury.  Spoke with family, family deciding on one-way extubation versus trach/PEG/vent SNF . 4/5: No clinical changes awaiting family decision . 4/6 family agreed to organ donation after death.  CDS evaluating CT chest pelvis and abdomen obtained per request of CPS.  Showed worsening bilateral airspace opacities in the lung, trace bilateral  effusions.  There were no intra-abdominal or pelvic pathology.  There is a small mass in the left ovary. . 4/7 still undergoing evaluation for donation. Awaiting CDS decisions.   Interim History / Subjective:    No significant changes, still undergoing CTS evaluation  Objective   Blood pressure 120/74, pulse (Abnormal) 53, temperature (Abnormal) 96.26 F (35.7 C), temperature source Core (Comment), resp. rate (Abnormal) 24, height 5\' 9"  (1.753 m), weight (Abnormal) 150.6 kg, SpO2 98 %.    Vent Mode: PRVC FiO2 (%):  [40 %] 40 % Set Rate:  [24 bmp] 24 bmp Vt Set:  [530 mL] 530 mL PEEP:  [5 cmH20] 5 cmH20 Plateau Pressure:  [21 cmH20-24 cmH20] 21 cmH20   Intake/Output Summary (Last 24 hours) at 03/25/2021 0901 Last data filed at 03/25/2021 0800 Gross per 24 hour  Intake 2875.58 ml  Output 1770 ml  Net 1105.58 ml   Filed Weights   03/23/21 0125 03/24/21 0351 03/25/21 0600  Weight: (Abnormal) 155.3 kg (Abnormal) 155.3 kg (Abnormal) 150.6 kg    Examination:  General this is an obese 50 year old female remains comatose on full ventilator support HEENT neck is large mucous membranes moist orally intubated Neuro: Leftward gaze preference occasional myoclonus previously  now improved on propofol Pulmonary: Coarse scattered rhonchi Cardiac: Regular rate and rhythm Abdomen soft nontender Extremities warm dry dependent edema GU clear/yellow urine   Labs/imaging that I havepersonally reviewed  (right click and "Reselect all SmartList Selections" daily)  See below   Resolved Hospital Problem list   Cardiogenic shock is resolved  Assessment & Plan:    Anoxic brain injury s/p PEA cardiac arrest: Myoclonus Confirmed via MRI  She is full DNR status awaiting organ donation decisions Plan Supportive care Continuing propofol for myoclonus  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 CTPE study negative. Plan Telemetry monitoring  Acute on chronic hypoxemic and hypercapnic  respiratory failure due to acute diastolic HF w/ pulmonary edema, c/b CAP vs aspiration (normal flora) - Underlying asthma with active tobacco use - underlying OHS/OSA untreated -Mental status primary barrier to extubation Plan Full ventilator support  VAP bundle  Bronchodilators  Solu-Medrol 40 mg a day, planning 2 more days  Continuing current Rx as we await CTS decision tomorrow  Hyperglycemia w/ poor glycemic control.  Plan Continue current glycemic control regimen  Fluid and electrolyte imbalance Plan Monitor  Mild leukocytosis Plan Trend fever and white blood cell curve  Best practice (right click and "Reselect all SmartList Selections" daily)  Diet:  Tube Feed  If oral type of diet: na Pain/Anxiety/Delirium protocol (if indicated): Yes (RASS goal 0) VAP protocol (if indicated): Yes DVT prophylaxis: Heparin GI prophylaxis: PPI Glucose control:  SSI Yes and Basal insulin Yes Central venous access:  N/A Arterial line:  N/A Foley:  Yes, and it is still needed Restraints ACTION; IS/IS NOT: is not Mobility:  bed rest  PT consulted: N/A Studies pending: none Culture data pending: blood culture  Last reviewed culture data:today  Antibiotics:ceftriaxone and azithromycin  Antibiotic de-escalation: Not indicated Stop date: has Planned stop date (if determined) stop azith on 4/7 and rocephin on 4/7 Daily labs: not indicated Code Status:  DNR Prognosis: Terminal Last date of multidisciplinary goals of care discussion on 4/6: Family agreed to reviewed no ideation, services following.  Currently awaiting final decision in regards to disposition  Disposition: ICU  My cct 34 min  Simonne Martinet ACNP-BC [facility] Pulmonary/Critical Care Pager # [phone] OR # [phone] if no answer

## 2021-03-25 NOTE — Progress Notes (Signed)
eLink Physician-Brief Progress Note Patient Name: Valerie James DOB: 1971-11-09 MRN: [id number]   Date of Service  03/25/2021  HPI/Events of Note  Oliguria - Albumin = 2.8. No CVL or CVP.  eICU Interventions  Plan: 1. 25% Albumin 12.5 gm IV now.      Intervention Category Major Interventions: Other:  Lenell Antu 03/25/2021, 2:34 AM

## 2021-03-26 LAB — GLUCOSE, CAPILLARY
Glucose-Capillary: 120 mg/dL — ABNORMAL HIGH (ref 70–99)
Glucose-Capillary: 100 mg/dL — ABNORMAL HIGH (ref 70–99)

## 2021-03-26 LAB — CULTURE, RESPIRATORY W GRAM STAIN: Culture: NO GROWTH

## 2021-03-26 MED ORDER — FENTANYL CITRATE (PF) 100 MCG/2ML IJ SOLN: 50.0000 ug | INTRAMUSCULAR | Status: DC | PRN

## 2021-03-26 MED ORDER — ATROPINE SULFATE 1 % OP SOLN
2.0000 [drp] | OPHTHALMIC | Status: DC | PRN
  Administered 2021-03-26: 2 [drp] via SUBLINGUAL
  Filled 2021-03-26: qty 2

## 2021-03-26 MED ORDER — IPRATROPIUM-ALBUTEROL 0.5-2.5 (3) MG/3ML IN SOLN: 3.0000 mL | Freq: Four times a day (QID) | RESPIRATORY_TRACT | Status: DC

## 2021-03-26 MED ORDER — FENTANYL BOLUS VIA INFUSION
100.0000 ug | INTRAVENOUS | Status: DC | PRN
  Administered 2021-03-26: 50 ug via INTRAVENOUS
  Administered 2021-03-26 (×2): 100 ug via INTRAVENOUS
  Filled 2021-03-26: qty 100

## 2021-03-26 MED ORDER — FENTANYL 2500MCG IN NS 250ML (10MCG/ML) PREMIX INFUSION
0.0000 ug/h | INTRAVENOUS | Status: DC
  Administered 2021-03-26: 50 ug/h via INTRAVENOUS
  Filled 2021-03-26: qty 250

## 2021-03-26 MED ORDER — ATROPINE SULFATE 1 % OP SOLN: 2.0000 [drp] | Freq: Four times a day (QID) | OPHTHALMIC | Status: DC

## 2021-03-29 LAB — CULTURE, BLOOD (ROUTINE X 2)
Culture: NO GROWTH
Special Requests: ADEQUATE

## 2021-03-30 LAB — CULTURE, BLOOD (ROUTINE X 2)
Culture: NO GROWTH
Special Requests: ADEQUATE

## 2021-04-18 NOTE — Progress Notes (Signed)
Pt time of death 12:16pm. Respiration no longer present, absent heart beat, pupils non-reactive. Family is present at the bedside. Pt pronounced via 2 RNs (Almira Coaster, RN & Zollie Scale, Charity fundraiser). CCM Made aware.

## 2021-04-18 NOTE — Progress Notes (Signed)
Attempted to call [facility] x3. Remains on hold then other line will pick up and hang up. Asked E-link for assistance with contacting them to report TOD.

## 2021-04-18 NOTE — Progress Notes (Signed)
Spoke with CCM during rounding and with Medical Examiner. Pt will not be an ME case.

## 2021-04-18 NOTE — Progress Notes (Signed)
Assisted tele visit to patient with family members.  Luevenia Mcavoy Anderson, RN   

## 2021-04-18 NOTE — Progress Notes (Signed)
Pt was extubated, family is back. Pt was difficult to get comfortable at the time. Pt having trouble with noisy breathing. Pt was positioned to help with this. Boluses given to assist with labored breathing. Family at the bedside and understand death is near. S/s of dying educated to family such at periods of apnea pt may experience and interment loud breathing. They verbalize understanding.

## 2021-04-18 NOTE — Progress Notes (Signed)
NAMEHenritta James, MRN:  [id number], DOB:  08-04-1971, LOS: 6 ADMISSION DATE:  03/31/2021, CONSULTATION DATE:  04/01/2021 REFERRING MD:  Rancour, CHIEF COMPLAINT: found down, PEA cardiac arrest.   History of Present Illness:  9 yof found by family unresponsive w/ agonal breathing.  On EMS arrival lost pulses, developed PEA, received CPR and 2 doses epinephrine.  King airway placed in the field, then intubated with ET tube in the ED.    Pertinent  Medical History  Obesity Asthma  Significant Hospital Events: Including procedures, antibiotic start and stop dates in addition to other pertinent events    4/2 Intubated, admitted to ICU after cardiac arrest. In ED received ASA 300 pr, ns 1L, fentanyl and versed started, levophed started.  COVID neg. EEG "almost entirely very suppressed/essentially "flat line" isoelectric background, except for very brief highly epileptiform bursts of cortical activity which correspond to myoclonus on video". CT brain neg for acute findings. Chronic para sinusitis.  CT chest negative for pulmonary emboli cardiomegaly noted with reflux into the dilated IVC suggesting right heart failure.  Echocardiogram positive McConnell sign RV systolic function mildly reduced RV size moderately to severely enlarged LV function normal with EF 60 to 65% . 4/3 further review w/ family uncovering recent URI symptoms. Long time concern for what sounds like sleep apnea, using cough syrup for symptoms which was making her more sleepy.Made DNR by family.  . 4/4 started on steroids and cont neb. For bronchospasm and inc. PIP. MRI confirmed anoxic injury.  Spoke with family, family deciding on one-way extubation versus trach/PEG/vent SNF . 4/5: No clinical changes awaiting family decision . 4/6 family agreed to organ donation after death.  CDS evaluating CT chest pelvis and abdomen obtained per request of CPS.  Showed worsening bilateral airspace opacities in the lung, trace bilateral  effusions.  There were no intra-abdominal or pelvic pathology.  There is a small mass in the left ovary. . 4/7 still undergoing evaluation for donation. Awaiting CDS decisions. . 4/8 deemed NOT a candidate for donation.  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  Interim History / Subjective:   Family currently at bedside  Objective   Blood pressure 125/80, pulse (Abnormal) 55, temperature (Abnormal) 96.8 F (36 C), temperature source Core, resp. rate (Abnormal) 24, height 5\' 9"  (1.753 m), weight (Abnormal) 150.6 kg, SpO2 98 %.    Vent Mode: PRVC FiO2 (%):  [40 %] 40 % Set Rate:  [24 bmp] 24 bmp Vt Set:  [530 mL] 530 mL PEEP:  [5 cmH20] 5 cmH20 Plateau Pressure:  [21 cmH20-26 cmH20] 21 cmH20   Intake/Output Summary (Last 24 hours) at 04/10/2021 0929 Last data filed at 04/10/21 0800 Gross per 24 hour  Intake 679.07 ml  Output 595 ml  Net 84.07 ml   Filed Weights   03/23/21 0125 03/24/21 0351 03/25/21 0600  Weight: (Abnormal) 155.3 kg (Abnormal) 155.3 kg (Abnormal) 150.6 kg    Examination:  General this is an obese 50 year old female she remains comatose and unresponsive on full ventilatory support HEENT orally intubated mucous membranes are moist sclera nonicteric neck is large Pulmonary remains on full ventilatory support scattered rhonchi Cardiac regular rate and rhythm Abdomen is soft not tender Extremities warm dry brisk capillary refill Neuro GCS 3 GU Foley catheter is clear   Labs/imaging that I havepersonally reviewed  (right click and "Reselect all SmartList Selections" daily)  See below   Resolved Hospital Problem list   Cardiogenic  shock is resolved  Assessment & Plan:    Anoxic brain injury s/p PEA cardiac arrest: 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 CTPE study negative. Acute on chronic hypoxemic and hypercapnic respiratory failure due to acute diastolic HF w/ pulmonary edema, c/b CAP vs aspiration (normal  flora) Hyperglycemia w/ poor glycemic control.  Intermittent Fluid and electrolyte imbalance Mild leukocytosis Left Ovarian mass Comfort care  Discussion 50 year old female status post cardiac arrest who unfortunately sustained fairly significant anoxic brain injury as a consequence of prolonged hypoperfusion.  She had been listed as an organ donor, unfortunately incidental finding during evaluation for donation identified a left ovarian mass ruling her out for organ donation candidacy.  Family is currently at bedside with plan for one-way extubation Plan Continue fentanyl for comfort Okay to continue propofol at low dosing given myoclonus Continue scheduled bronchodilators Discontinue stool softeners Discontinue sliding scale insulin As needed Versed if needed for seizure or discomfort Add atropine oral drops for oral secretions Continue family support.   Best practice (right click and "Reselect all SmartList Selections" daily)  Diet:  NPO If oral type of diet: na Pain/Anxiety/Delirium protocol (if indicated): No VAP protocol (if indicated): Not indicated DVT prophylaxis: Other (comment) GI prophylaxis: N/A Glucose control:  SSI No Central venous access:  N/A Arterial line:  N/A Foley:  Yes, and it is still needed Restraints ACTION; IS/IS NOT: is not Mobility:  bed rest  PT consulted: N/A Studies pending: none Culture data pending: none  Last reviewed culture data:today  Antibiotics:ceftriaxone and azithromycin  Antibiotic de-escalation: Not indicated Stop date: has Planned stop date (if determined) stop azith on 4/7 and rocephin on 4/7 Daily labs: not indicated Code Status:  DNR Prognosis: Terminal   No charge  Simonne Martinet ACNP-BC [facility] Pulmonary/Critical Care Pager # [phone] OR # [phone] if no answer

## 2021-04-18 NOTE — Progress Notes (Signed)
14:37- Spoke with Medical Examiner, Due to pt being + for benzos and opiates on admission pt will be ME case.

## 2021-04-18 NOTE — Progress Notes (Signed)
[facility] notified of TOD. Referral # [id number]. Notified bedside RN Almira Coaster to perform an eye prep per their request.

## 2021-04-18 NOTE — Progress Notes (Signed)
Patient was compassionately extubated at 1056 per MD order/family wishes.

## 2021-04-18 DEATH — deceased

## 2021-04-23 DIAGNOSIS — I5031 Acute diastolic (congestive) heart failure: Secondary | ICD-10-CM

## 2021-04-23 DIAGNOSIS — J9601 Acute respiratory failure with hypoxia: Secondary | ICD-10-CM

## 2021-04-23 DIAGNOSIS — J45901 Unspecified asthma with (acute) exacerbation: Secondary | ICD-10-CM

## 2021-05-19 NOTE — Death Summary Note (Signed)
DEATH SUMMARY   Patient Details  Name: Valerie James MRN: [id number] DOB: [birth year]  Admission/Discharge Information   Admit Date:  04-15-21  Date of Death: Date of Death: Apr 21, 2021  Time of Death: Time of Death: 04/30/1215  Length of Stay: 6  Referring Physician: Jeanella Cara, NP   Reason(s) for Hospitalization  Altered mental status  Diagnoses  Preliminary cause of death: Per medical examiner Secondary Diagnoses (including complications and co-morbidities):  Active Problems:   Cardiac arrest (HCC)   Anoxic brain injury (HCC)   Acute hypoxemic respiratory failure (HCC)   Asthma exacerbation   Acute diastolic heart failure [facility])  Brief Hospital Course (including significant findings, care, treatment, and services provided and events leading to death)   Valerie James is a 50 y.o. year old female  was found unresponsive for unknown period of time. On arrival of EMS, patient had PEA s/p CPR x 10 minutes and achieved ROSC after epi x 2. In the ED, king airway was exchanged for ETT. She was admitted to ICU after cardiac arrest. COVID neg. EEG "almost entirely very suppressed/essentially "flat line" isoelectric background, except for very brief highly epileptiform bursts of cortical activity which correspond to myoclonus on video". CT brain neg for acute findings. Chronic para sinusitis.  CT chest negative for pulmonary emboli cardiomegaly noted with reflux into the dilated IVC suggesting right heart failure.  Echocardiogram positive McConnell sign RV systolic function mildly reduced RV size moderately to severely enlarged LV function normal with EF 60 to 65% On further review w/ family uncovering recent URI symptoms. Long time concern for what sounds like sleep apnea, using cough syrup for symptoms which was making her more sleepy.    Family discussion held and patient was made DNR by family. Family agreed to organ donation evaluation after death however was deemed not a candidate for  donation. Patient was compassionately extubated and expired at 12:15 PM.    Pertinent Labs and Studies  Significant Diagnostic Studies US Abdomen Complete  Result Date: 03/25/2021 CLINICAL DATA:  Organ donor. EXAM: ABDOMEN ULTRASOUND COMPLETE COMPARISON:  CT 03/24/2021. FINDINGS: Gallbladder: Gallbladder is small and contracted. Mild gallbladder wall thickening at 5 mm. This may be from contracted state. Clinical correlation suggested in order to exclude cholecystitis. Common bile duct: Diameter: 2.6 mm Liver: Nodular hepatic contour. Cirrhosis cannot be excluded. No focal hepatic mass identified. Portal vein is patent on color Doppler imaging with normal direction of blood flow towards the liver. IVC: No abnormality visualized. Pancreas: Visualized portion unremarkable. Spleen: Size and appearance within normal limits. Right Kidney: Length: 12.2 cm. Echogenicity within normal limits. No mass or hydronephrosis visualized. Left Kidney: Length: 13.3 cm. Echogenicity within normal limits. No mass or hydronephrosis visualized. Abdominal aorta: No aneurysm visualized. Other findings: Exam was very limited due to patient's body habitus, inability to change position, and overlying bowel gas. IMPRESSION: 1. Gallbladder is contracted. Mild gallbladder wall thickening. This may be from contracted state. Clinical correlation in order to exclude cholecystitis suggested. 2.  Nodular hepatic contour.  Cirrhosis can not be excluded. Very limited exam due to patient's body habitus, inability to change position, and overlying bowel gas. Electronically Signed   By: Maisie Fus  Register   On: 03/25/2021 12:53   US PELVIC COMPLETE WITH TRANSVAGINAL  Result Date: 03/25/2021 CLINICAL DATA:  Further evaluation of left ovarian cyst seen on prior CT EXAM: TRANSABDOMINAL AND TRANSVAGINAL ULTRASOUND OF PELVIS TECHNIQUE: Both transabdominal and transvaginal ultrasound examinations of the pelvis were performed. Transabdominal technique  was performed  for global imaging of the pelvis including uterus, ovaries, adnexal regions, and pelvic cul-de-sac. It was necessary to proceed with endovaginal exam following the transabdominal exam to visualize the uterus, endometrium and adnexa. COMPARISON:  CT March 24, 2021 FINDINGS: Uterus Measurements: 10 x 5.3 x 4.7 cm = volume: 129 mL. No fibroids or other mass visualized. Endometrium Thickness: 4 mm.  No focal abnormality visualized. Right ovary Not visualized Left ovary Measurements: 6.0 x 5.8 x 4.9 cm = volume: 89 mL. Complex cystic left ovarian lesion which measures 4.5 x 4.4 x 3.8 cm with multiple septations and a internal focus of mural soft tissue nodularity. Other findings No abnormal free fluid. IMPRESSION: 1. Complex cystic left ovarian lesion measuring up to 4.5 cm. 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 2. Nonvisualization of the right ovary. 3. Unremarkable sonographic appearance of the uterus and endometrium. Electronically Signed   By: Maudry Mayhew MD   On: 03/25/2021 13:26   CT CHEST ABDOMEN PELVIS WO CONTRAST  Result Date: 03/24/2021 CLINICAL DATA:  50 year old female for organ donation. EXAM: CT CHEST, ABDOMEN AND PELVIS WITHOUT CONTRAST TECHNIQUE: Multidetector CT imaging of the chest, abdomen and pelvis was performed following the standard protocol without IV contrast. COMPARISON:  Chest CT dated 04/01/2021. FINDINGS: Evaluation of this exam is limited in the absence of intravenous contrast as well as streak artifact caused by patient's arms. CT CHEST FINDINGS Cardiovascular: Mild cardiomegaly. No pericardial effusion. The thoracic aorta and central pulmonary arteries are grossly unremarkable on this noncontrast CT. Mediastinum/Nodes: No hilar or mediastinal adenopathy. Evaluation however is limited in the absence of contrast and consolidative changes of the adjacent lungs. An enteric tube is noted within the esophagus. No mediastinal fluid collection.  Lungs/Pleura: Bilateral lower lobe consolidative changes with air bronchograms, worsened since the prior CT and concerning for worsening pneumonia. Clinical correlation is recommended. Additional clusters of ground-glass opacity noted in the upper lobes bilaterally. Probable trace bilateral pleural effusions. No pneumothorax. Endotracheal tube is noted with tip approximately 3.3 cm above the carina. A curvilinear density at the carina and right mainstem bronchus, new since the prior CT, may represent mucous secretion or aspiration. The central airways are patent. Musculoskeletal: Old left posterior eleventh rib fracture. No acute osseous pathology. CT ABDOMEN PELVIS FINDINGS No intra-abdominal free air or free fluid. Hepatobiliary: Mild irregularity of the liver contour and enlargement of the left lobe of the liver, likely early changes of cirrhosis. Clinical correlation is recommended. No intrahepatic biliary dilatation. Cholecystectomy. No retained calcified stone noted in the central CBD. Pancreas: Unremarkable. No pancreatic ductal dilatation or surrounding inflammatory changes. Spleen: Normal in size without focal abnormality. Adrenals/Urinary Tract: The adrenal glands unremarkable. Indeterminate subcentimeter partially exophytic lesion from the posterior interpolar left kidney is not characterized on this CT. This can be better evaluated with ultrasound. There is no hydronephrosis or nephrolithiasis on either side. The visualized ureters are unremarkable. The urinary bladder is decompressed around a Foley catheter. Stomach/Bowel: Enteric tube with tip in the body of the stomach. There is moderate stool throughout the colon. There is no bowel obstruction or active inflammation. The appendix is normal. Vascular/Lymphatic: Minimal aortoiliac atherosclerotic disease. The IVC is unremarkable. No portal venous gas. There is no adenopathy. Reproductive: The uterus is anteverted and grossly unremarkable. There is a  complex low attenuating mass in the left ovary measuring 5.1 x 5.3 cm. Further characterization with ultrasound is recommended. The right ovary is unremarkable. Other: Small fat containing umbilical hernia. Mild subcutaneous edema  and clusters of stranding in the anterior abdomen likely related to subcutaneous injections. No fluid collection. Musculoskeletal: Degenerative changes of the spine. No acute osseous pathology. IMPRESSION: 1. Interval worsening of bilateral airspace opacities concerning for worsening pneumonia. Clinical correlation is recommended. 2. Probable trace bilateral pleural effusions. 3. No acute intra-abdominal or pelvic pathology. 4. Complex low attenuating mass in the left ovary. Further characterization with ultrasound recommended. 5. Aortic Atherosclerosis (ICD10-I70.0). Electronically Signed   By: Elgie Collard M.D.   On: 03/24/2021 16:02    Microbiology No results found for this or any previous visit (from the past 240 hour(s)).  Lab Basic Metabolic Panel: No results for input(s): NA, K, CL, CO2, GLUCOSE, BUN, CREATININE, CALCIUM, MG, PHOS in the last 168 hours. Liver Function Tests: No results for input(s): AST, ALT, ALKPHOS, BILITOT, PROT, ALBUMIN in the last 168 hours. No results for input(s): LIPASE, AMYLASE in the last 168 hours. No results for input(s): AMMONIA in the last 168 hours. CBC: No results for input(s): WBC, NEUTROABS, HGB, HCT, MCV, PLT in the last 168 hours. Cardiac Enzymes: No results for input(s): CKTOTAL, CKMB, CKMBINDEX, TROPONINI in the last 168 hours. Sepsis Labs: No results for input(s): PROCALCITON, WBC, LATICACIDVEN in the last 168 hours.  Procedures/Operations  ETT   Valerie James Valerie James 04/23/2021, 9:33 AM

## 2022-03-11 IMAGING — CT CT ANGIO CHEST
2 of 7 series · 18 of 46 positions shown · IV contrast (APPLIED)
Comparison: Chest radiograph from earlier today.

CLINICAL DATA: Post CPR, intubated, concern for PE

EXAM:
CT ANGIOGRAPHY CHEST WITH CONTRAST
TECHNIQUE: Multidetector CT imaging of the chest was performed using the
standard protocol during bolus administration of intravenous
contrast. Multiplanar CT image reconstructions and MIPs were
obtained to evaluate the vascular anatomy.
CONTRAST:  100mL OMNIPAQUE IOHEXOL 350 MG/ML SOLN

[Series 6: thins · axial · 0.89mm/px · z∈[-692,-420]mm · 15 of 435 slices shown]
[im 23/435  lung]
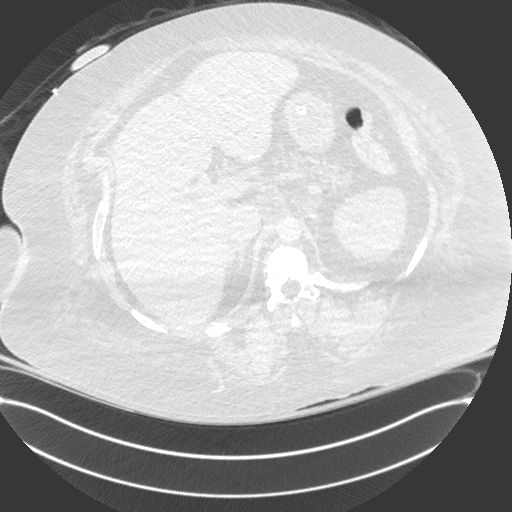
[im 46/435  soft-tissue]
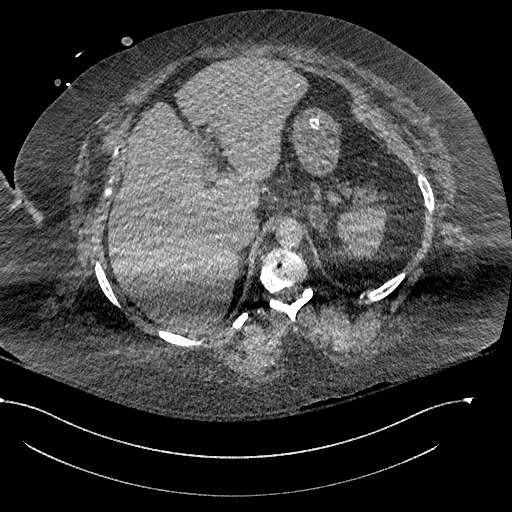
[im 92/435  lung]
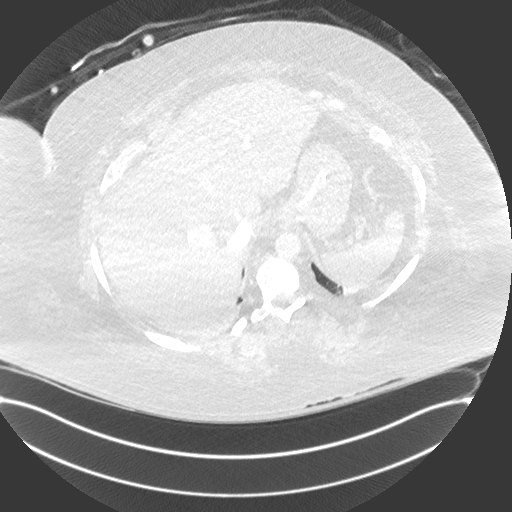
[im 115/435  soft-tissue]
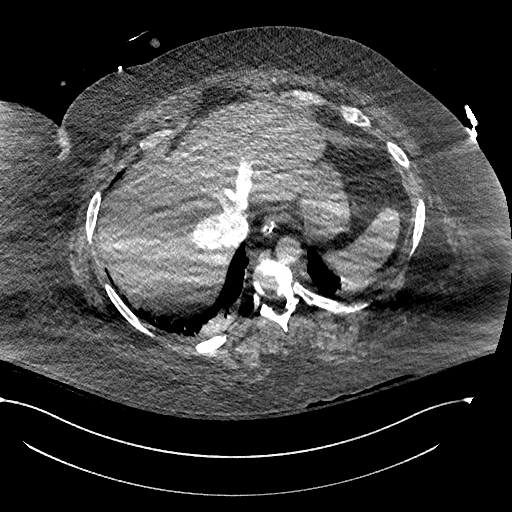
[im 138/435  lung]
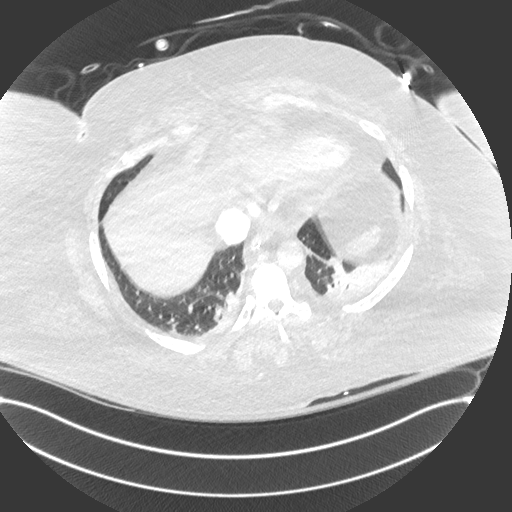
[im 160/435  soft-tissue]
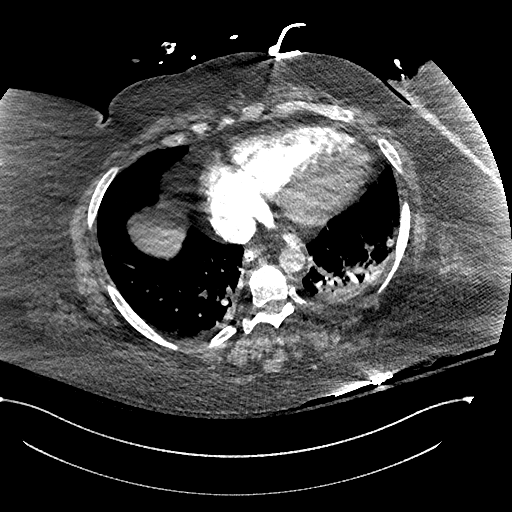
[im 183/435  lung]
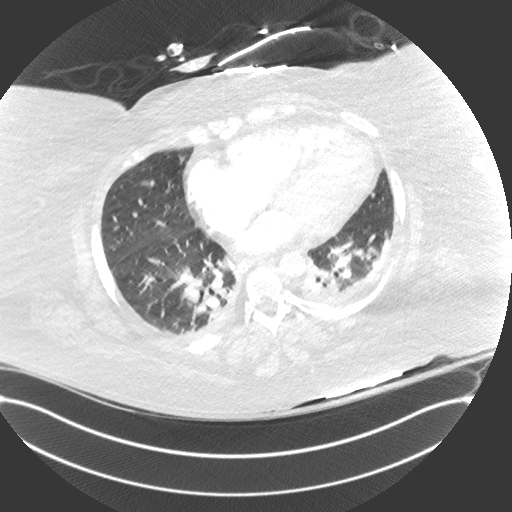
[im 229/435  soft-tissue]
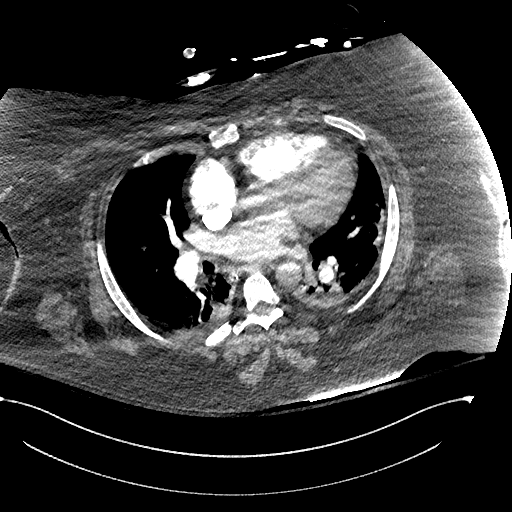
[im 252/435  lung]
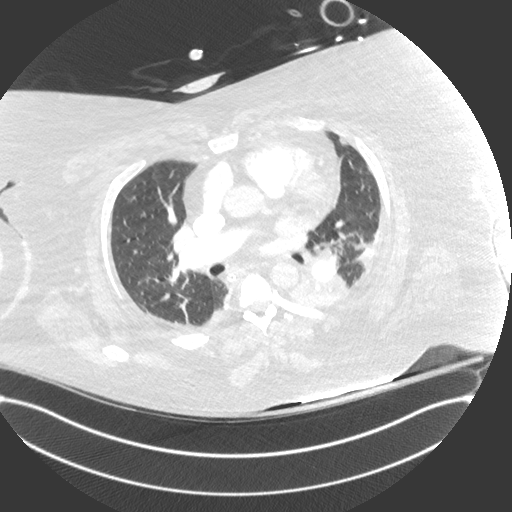
[im 275/435  soft-tissue]
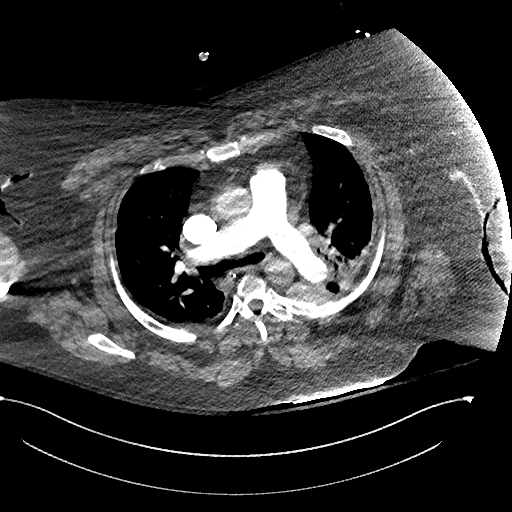
[im 297/435  lung]
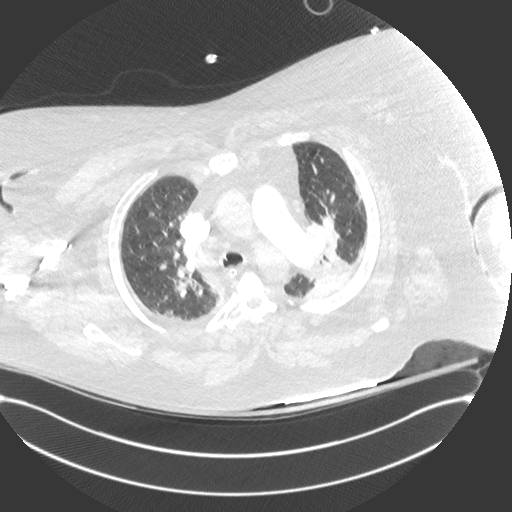
[im 320/435  soft-tissue]
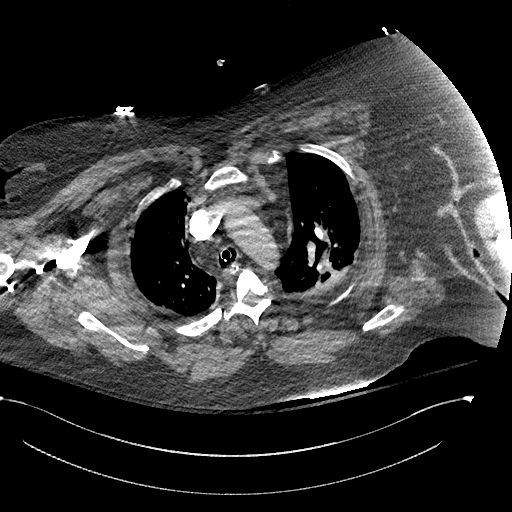
[im 366/435  lung]
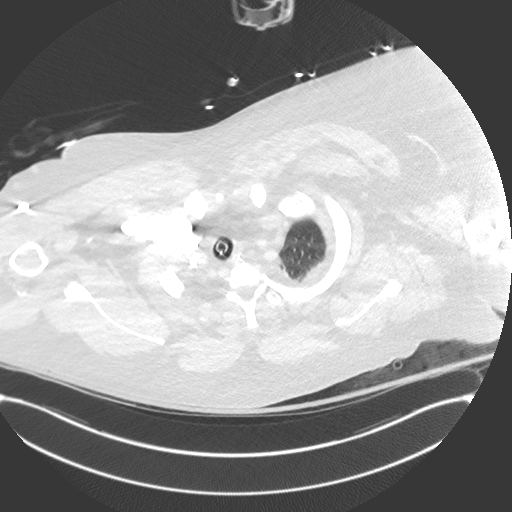
[im 389/435  soft-tissue]
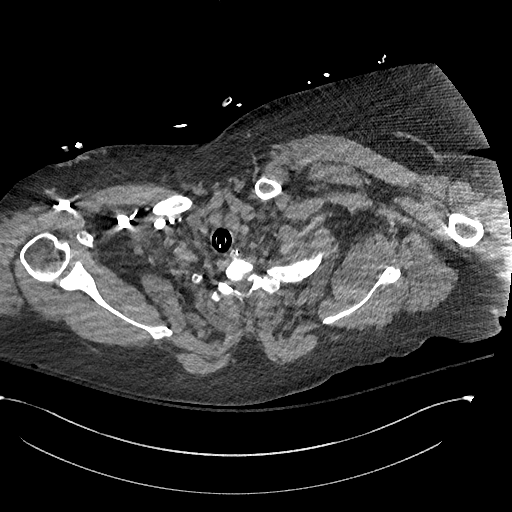
[im 412/435  lung]
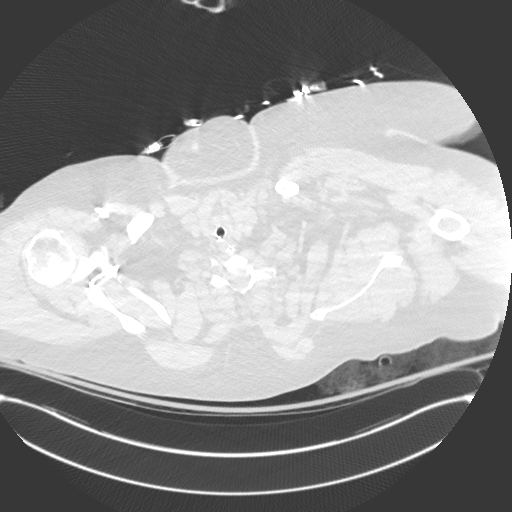

[Series 8: cor · coronal · 0.71mm/px · 3 of 190 slices shown]
[im 48/190  soft-tissue]
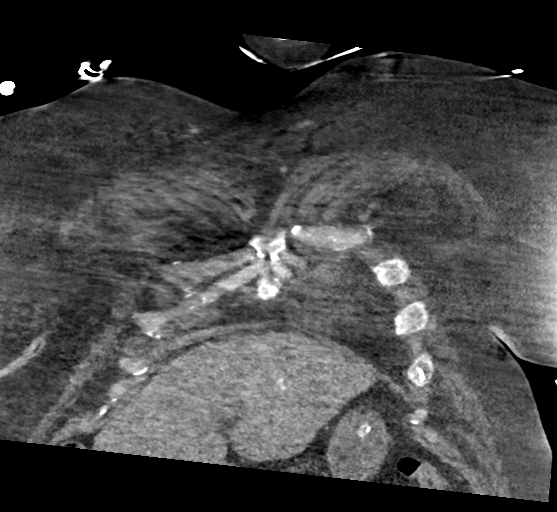
[im 95/190  soft-tissue]
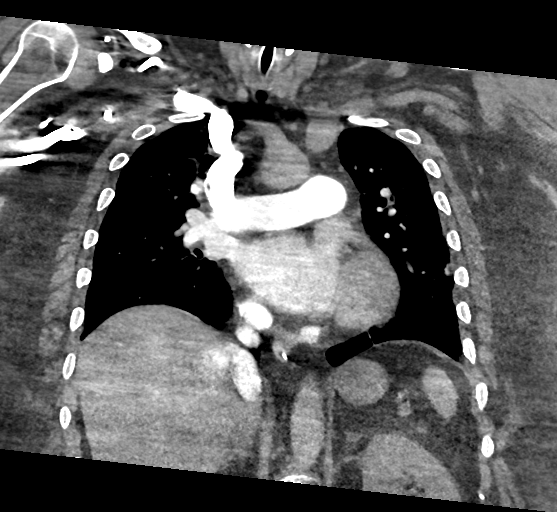
[im 142/190  soft-tissue]
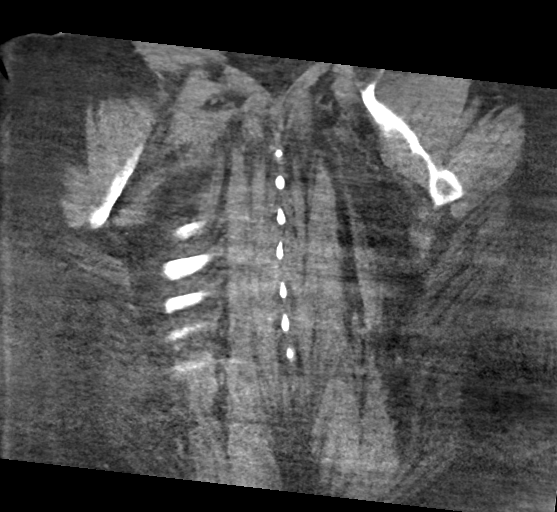

[18 of 46 positions shown; findings below may reference images not displayed]

FINDINGS: Cardiovascular: The study is moderate quality for the evaluation of
pulmonary embolism, with some motion degradation. There are no
convincing filling defects in the central, lobar, segmental or
subsegmental pulmonary artery branches to suggest acute pulmonary
embolism. Normal course and caliber of the thoracic aorta.
Top-normal caliber main pulmonary artery (3.0 cm diameter). Mild
cardiomegaly. No significant pericardial fluid/thickening.

Mediastinum/Nodes: No discrete thyroid nodules. Unremarkable
esophagus. No pathologically enlarged axillary, mediastinal or hilar
lymph nodes.

Lungs/Pleura: No pneumothorax. No pleural effusion. Endotracheal
tube tip is 2.4 cm above the carina. Moderate hypoventilatory
changes throughout the dependent lungs. No lung masses or
significant pulmonary nodules in the aerated portions of the lungs.

Upper abdomen: Enteric tube enters the stomach with the tip not seen
on this study. Contrast reflux into the dilated IVC and hepatic
veins.

Musculoskeletal:  No aggressive appearing focal osseous lesions.

Review of the MIP images confirms the above findings.
IMPRESSION: 1. No evidence of pulmonary embolism.
2. Mild cardiomegaly. Contrast reflux into the dilated IVC and
hepatic veins, suggesting right heart failure.
3. Moderate hypoventilatory changes throughout the dependent lungs.
4. Well-positioned support structures.

## 2022-03-11 IMAGING — CT CT HEAD W/O CM
4 series · 16 of 47 positions shown, 18 images · non-contrast
Comparison: None.

CLINICAL DATA: Head trauma, post CPR, intubated

EXAM:
CT HEAD WITHOUT CONTRAST
TECHNIQUE: Contiguous axial images were obtained from the base of the skull
through the vertex without intravenous contrast.

[Series 3: head wo · axial · 0.45mm/px · z∈[-325,-205]mm · 7 of 34 slices shown, 9 images]
[im 5/34  brain]
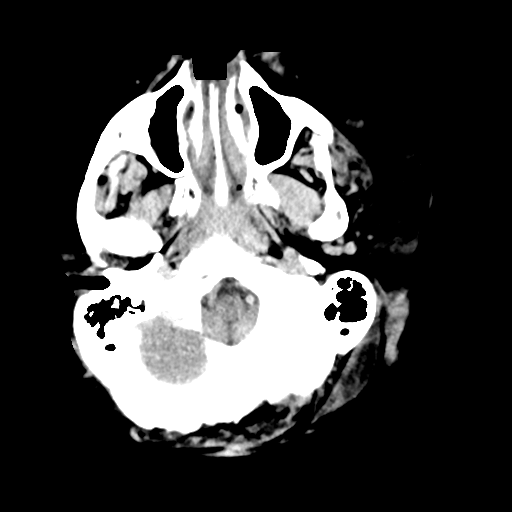
[im 5/34  bone]
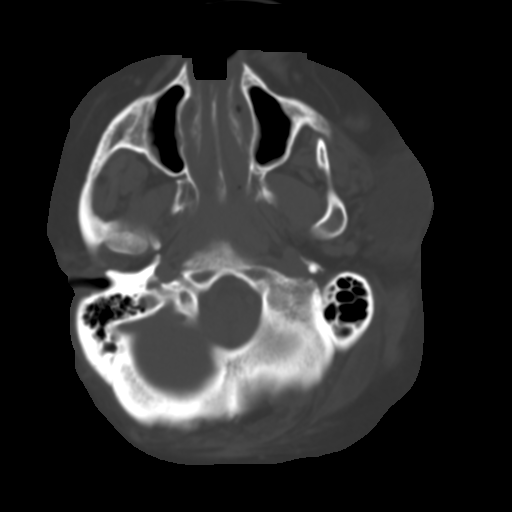
[im 9/34  brain]
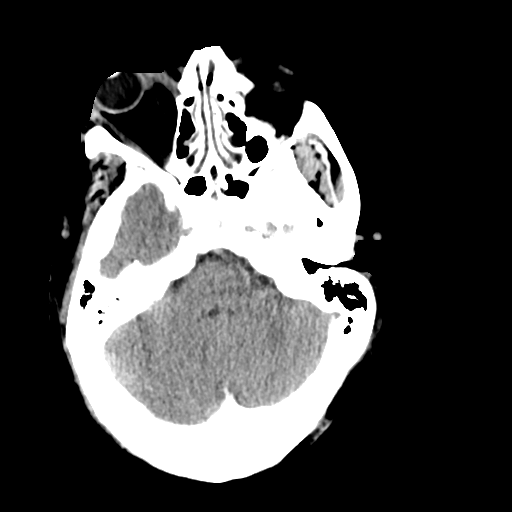
[im 13/34  brain]
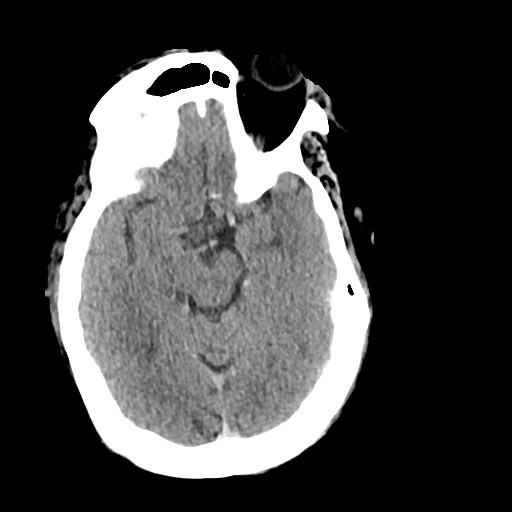
[im 17/34  brain]
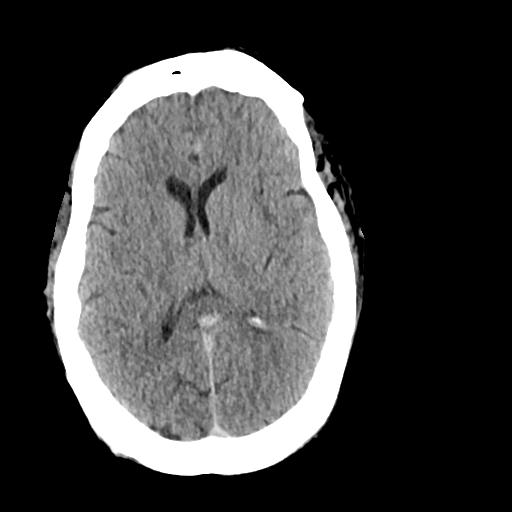
[im 21/34  brain]
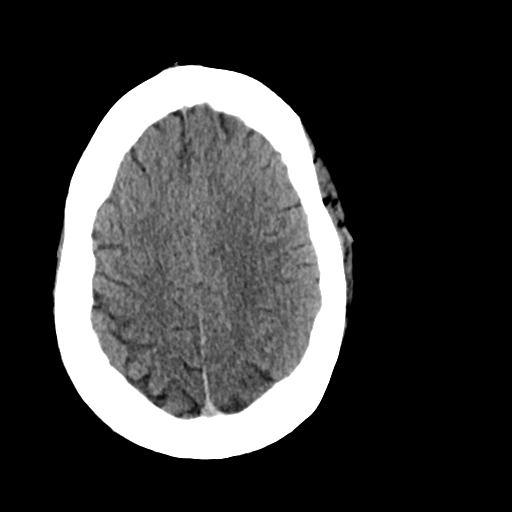
[im 21/34  bone]
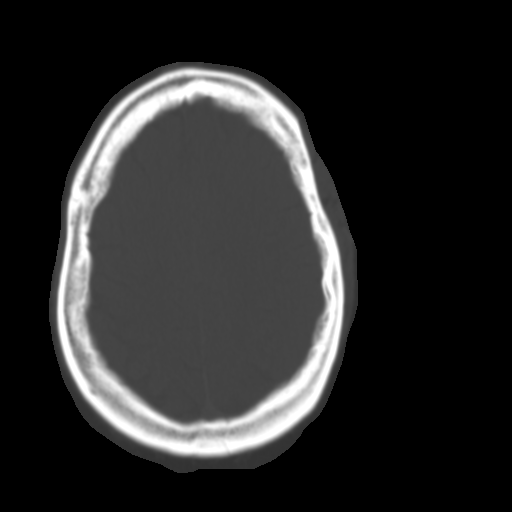
[im 25/34  brain]
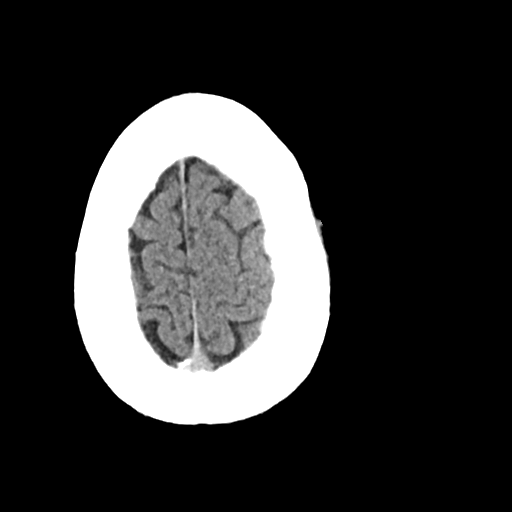
[im 29/34  brain]
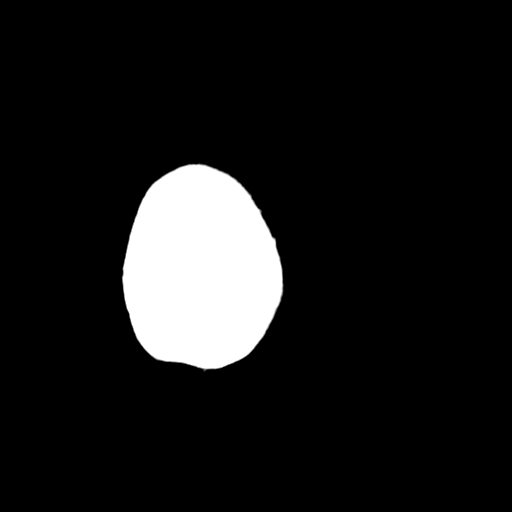

[Series 4: head bone · axial · 0.45mm/px · z∈[-329,-297]mm · 3 of 83 slices shown]
[im 9/83  bone]
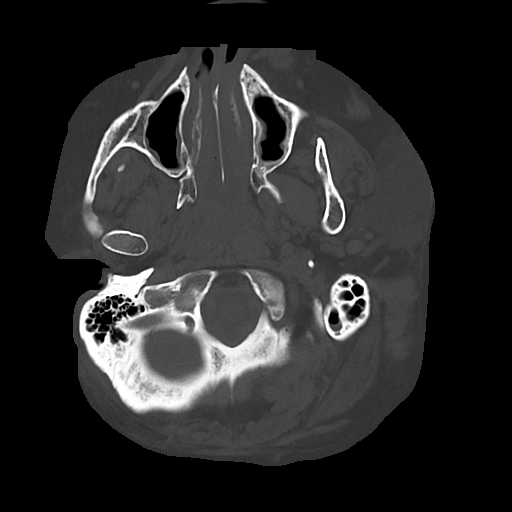
[im 17/83  bone]
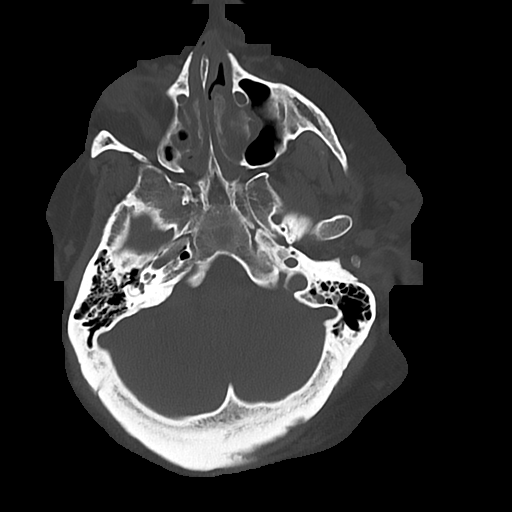
[im 25/83  bone]
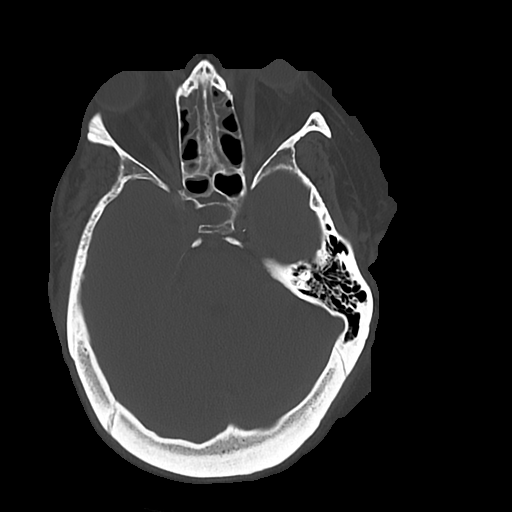

[Series 5: cor soft · coronal · 0.36mm/px · 3 of 76 slices shown]
[im 26/76  brain]
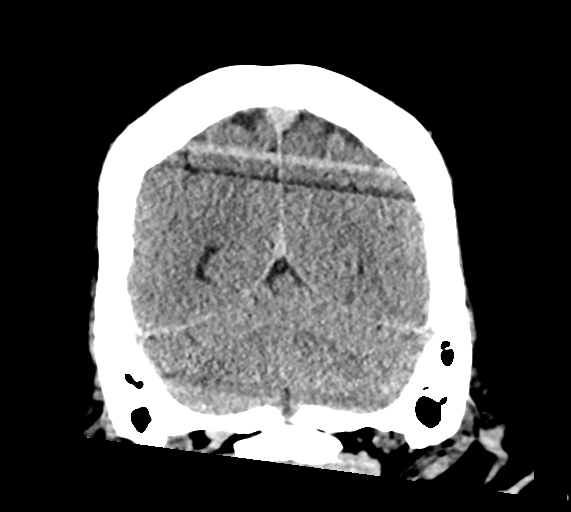
[im 34/76  brain]
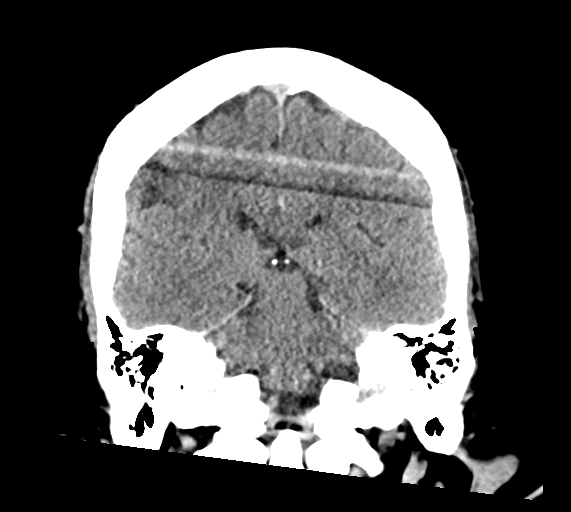
[im 42/76  brain]
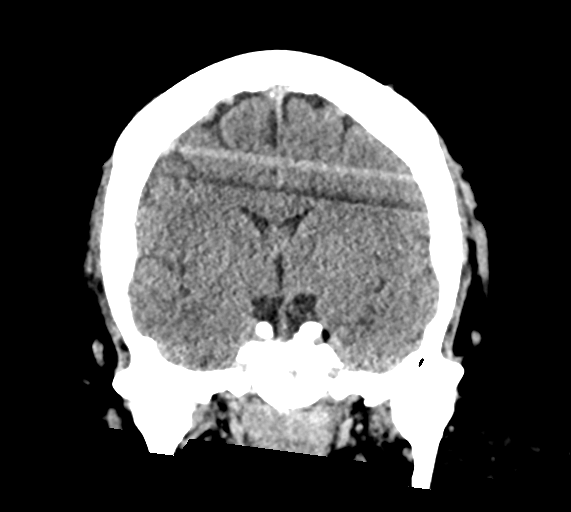

[Series 6: sag soft · sagittal · 0.36mm/px · 3 of 68 slices shown]
[im 26/68  brain]
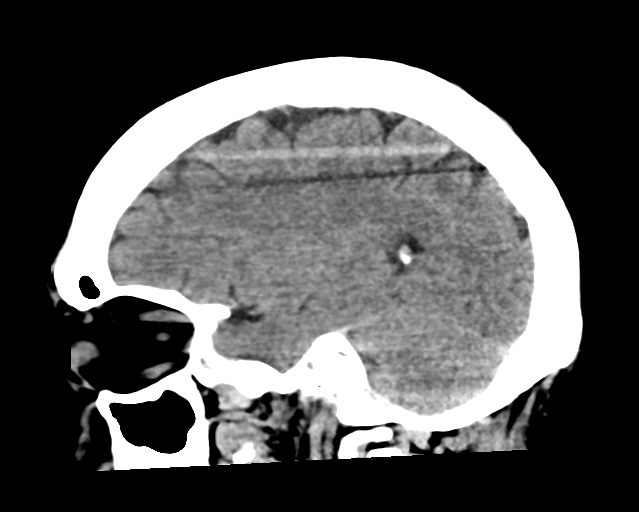
[im 34/68  brain]
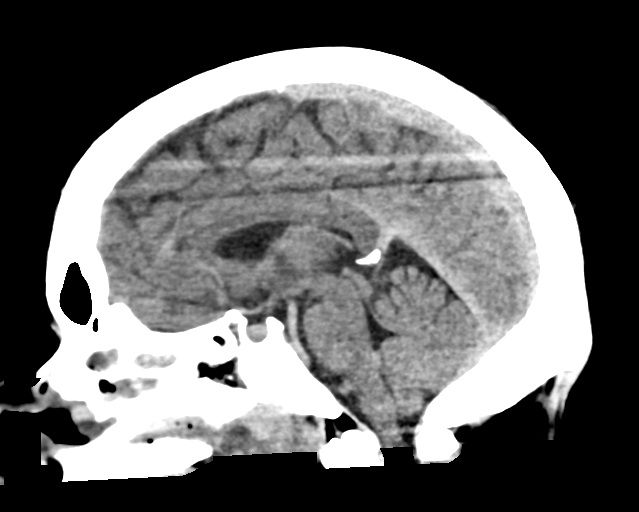
[im 42/68  brain]
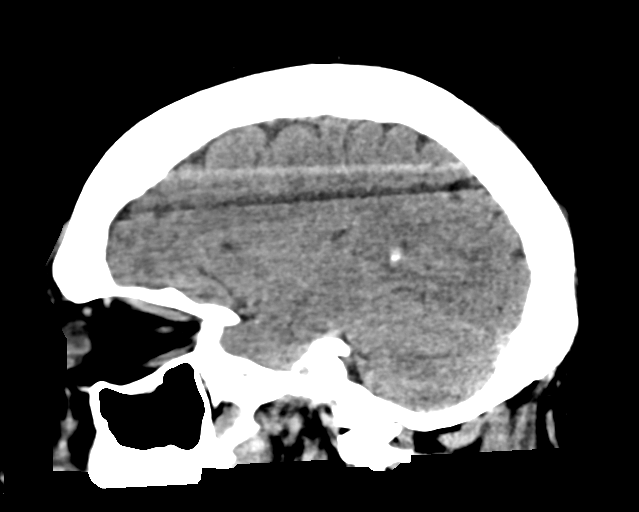

[16 of 47 positions shown; findings below may reference images not displayed]

FINDINGS: Brain: No evidence of parenchymal hemorrhage or extra-axial fluid
collection. No mass lesion, mass effect, or midline shift. No CT
evidence of acute infarction. Cerebral volume is age appropriate. No
ventriculomegaly.

Vascular: No acute abnormality.

Skull: No evidence of calvarial fracture.

Sinuses/Orbits: Mucoperiosteal thickening throughout the visualized
paranasal sinuses, without fluid levels.

Other:  The mastoid air cells are unopacified.
IMPRESSION: 1. No evidence of acute intracranial abnormality. No evidence of
calvarial fracture.
2. Chronic appearing paranasal sinusitis.

## 2022-03-16 IMAGING — US US PELVIS COMPLETE WITH TRANSVAGINAL
1 series · 13 of 25 positions shown · non-contrast
Comparison: CT March 24, 2021

CLINICAL DATA: Further evaluation of left ovarian cyst seen on
prior CT

EXAM:
TRANSABDOMINAL AND TRANSVAGINAL ULTRASOUND OF PELVIS
TECHNIQUE: Both transabdominal and transvaginal ultrasound examinations of the
pelvis were performed. Transabdominal technique was performed for
global imaging of the pelvis including uterus, ovaries, adnexal
regions, and pelvic cul-de-sac. It was necessary to proceed with
endovaginal exam following the transabdominal exam to visualize the
uterus, endometrium and adnexa.

[Series 1: us pelvic complete with transvaginal · 75 acquisitions, 13 frames shown]
[im 1/75]
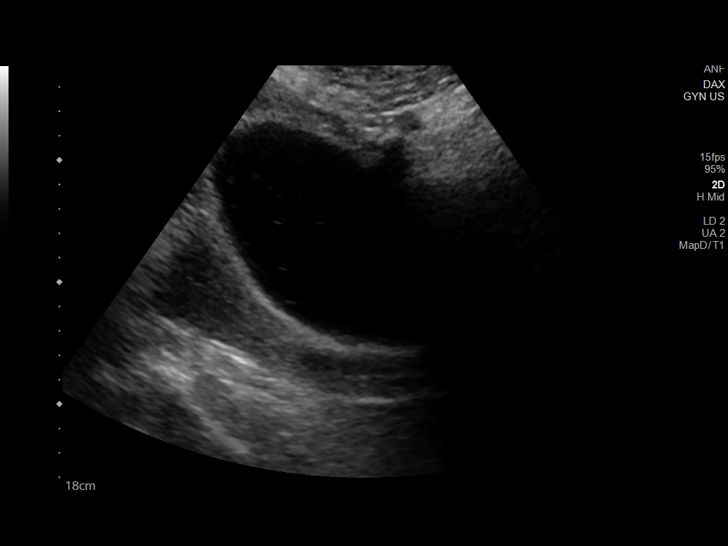
[im 7/75]
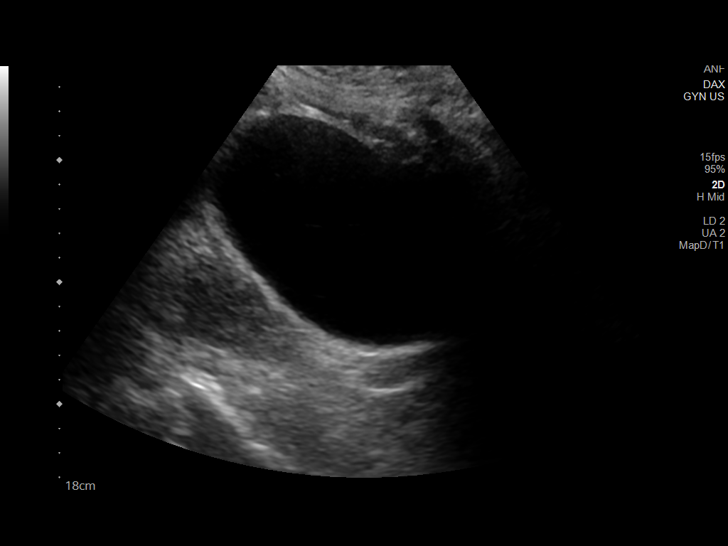
[im 13/75]
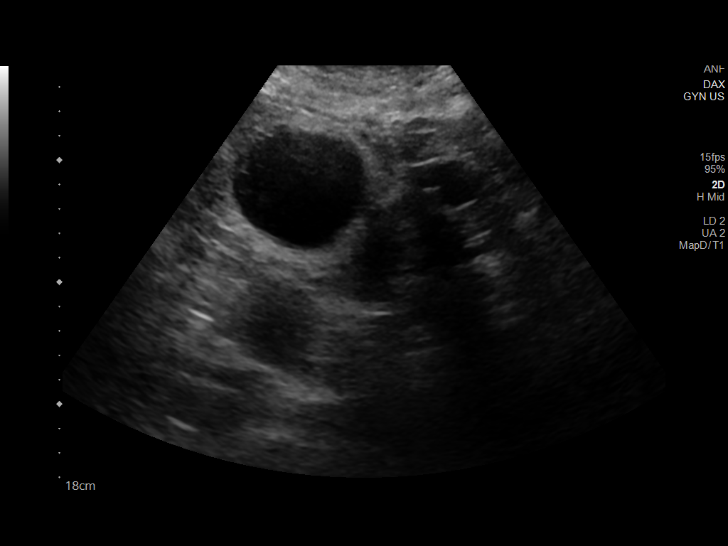
[im 19/75]
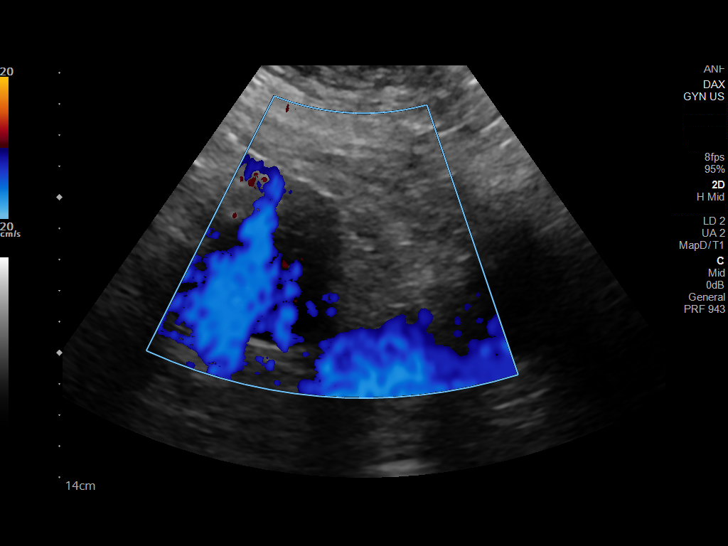
[im 25/75]
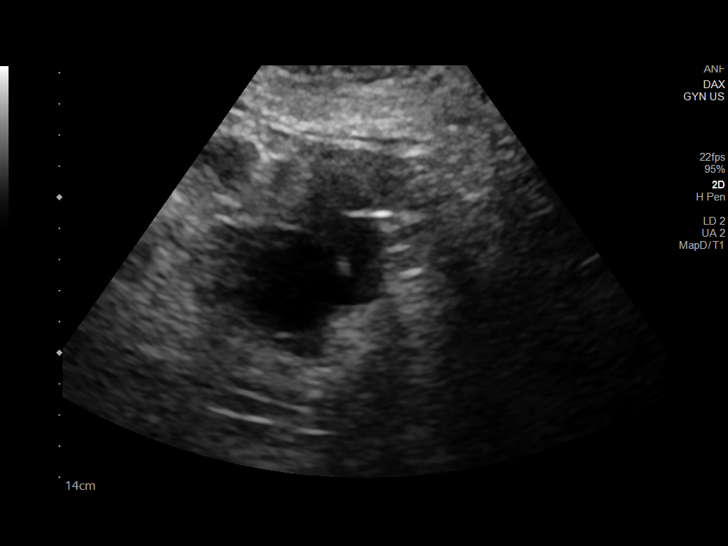
[im 31/75]
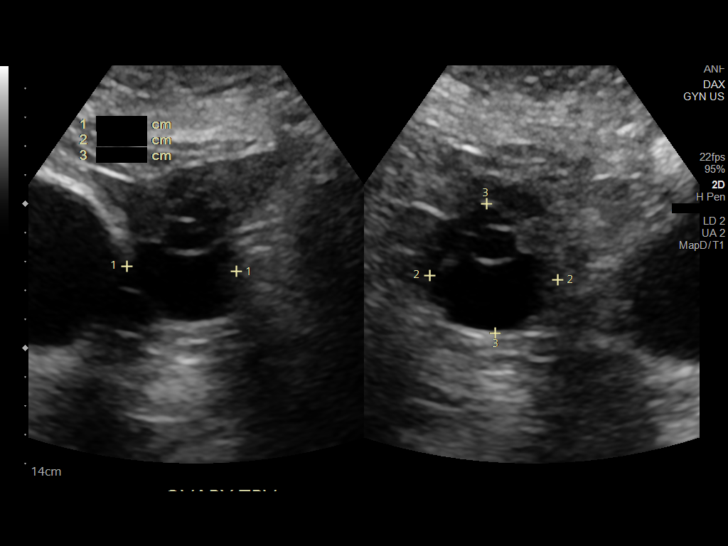
[im 38/75]
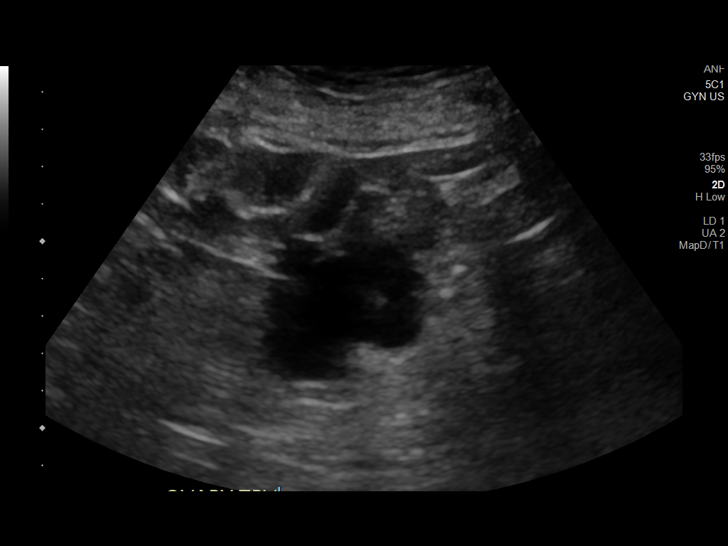
[im 44/75]
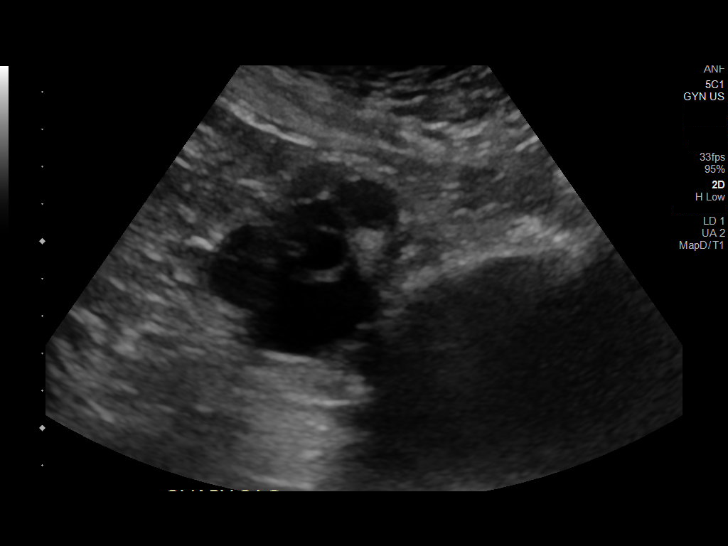
[im 50/75]
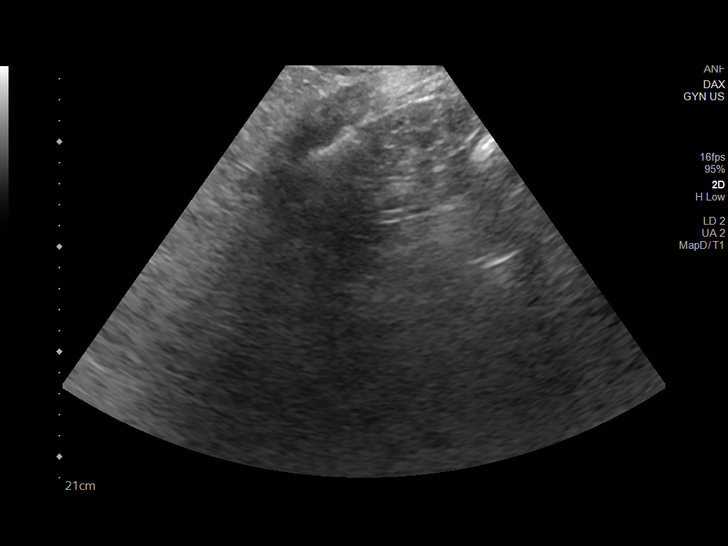
[im 56/75]
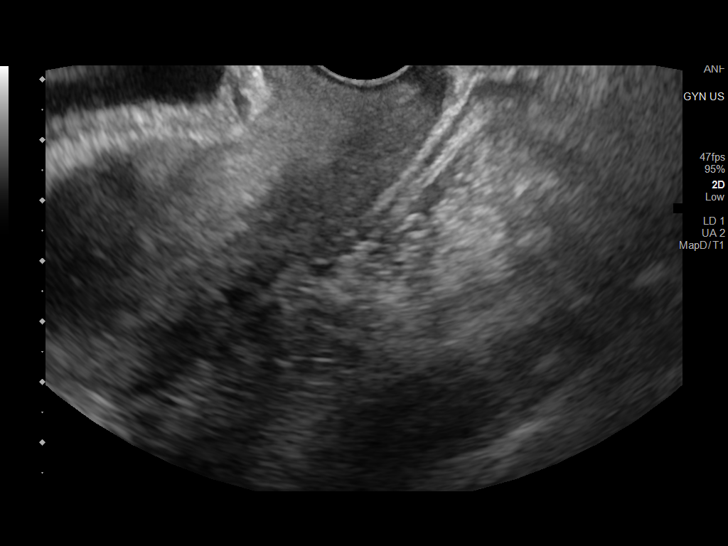
[im 62/75]
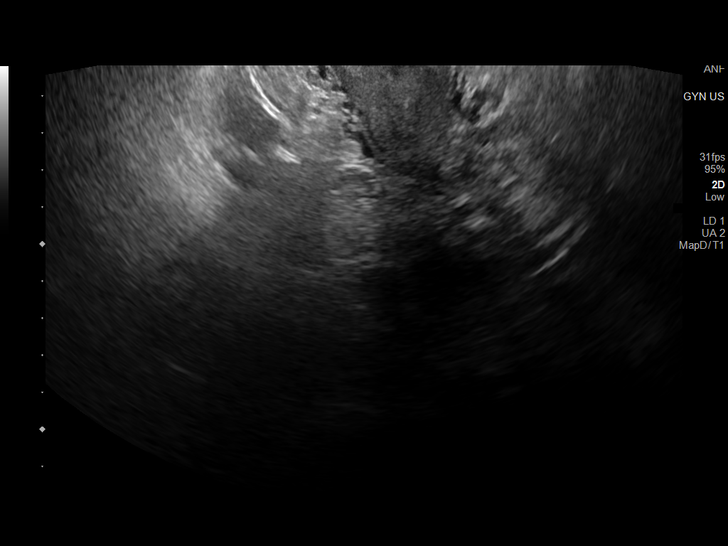
[im 68/75]
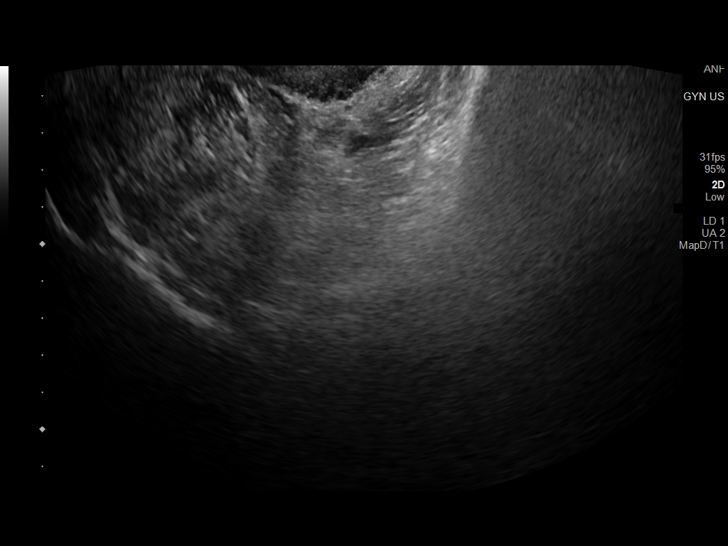
[im 75/75]
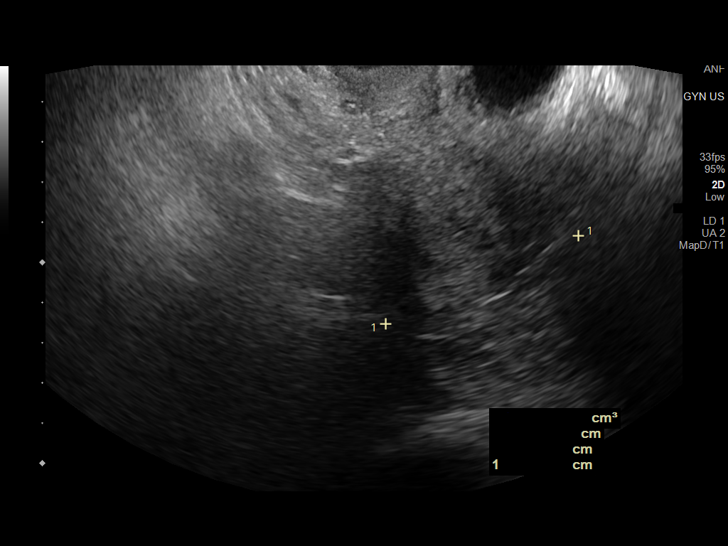

[13 of 25 positions shown; findings below may reference images not displayed]

FINDINGS: Uterus

Measurements: 10 x 5.3 x 4.7 cm = volume: 129 mL. No fibroids or
other mass visualized.

Endometrium

Thickness: 4 mm.  No focal abnormality visualized.

Right ovary

Not visualized

Left ovary

Measurements: 6.0 x 5.8 x 4.9 cm = volume: 89 mL. Complex cystic
left ovarian lesion which measures 4.5 x 4.4 x 3.8 cm with multiple
septations and a internal focus of mural soft tissue nodularity.

Other findings

No abnormal free fluid.
IMPRESSION: 1. Complex cystic left ovarian lesion measuring up to 4.5 cm.
Sonographic findings which are concerning for cystic ovarian
neoplasm.
2. Nonvisualization of the right ovary.
3. Unremarkable sonographic appearance of the uterus and
endometrium.
